# Patient Record
Sex: Female | Born: 2000 | Race: White | Hispanic: No | Marital: Single | Smoking: Current every day smoker
Health system: Southern US, Community
[De-identification: ages and names within clinical notes are randomized; demographics above are authoritative.]

## PROBLEM LIST (undated history)

## (undated) DIAGNOSIS — F329 Major depressive disorder, single episode, unspecified: Secondary | ICD-10-CM

## (undated) DIAGNOSIS — F32A Depression, unspecified: Secondary | ICD-10-CM

## (undated) DIAGNOSIS — K219 Gastro-esophageal reflux disease without esophagitis: Secondary | ICD-10-CM

## (undated) HISTORY — DX: Depression, unspecified: F32.A

## (undated) HISTORY — PX: OTHER SURGICAL HISTORY: SHX169

---
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
## 2020-01-01 ENCOUNTER — Encounter: Payer: Medicaid Other | Admitting: Advanced Practice Midwife

## 2020-01-01 LAB — CBC
HCT: 29.6 % — ABNORMAL LOW (ref 36.0–46.0)
Hemoglobin: 9.8 g/dL — ABNORMAL LOW (ref 12.0–15.0)
MCH: 29.8 pg (ref 26.0–34.0)
MCHC: 33.1 g/dL (ref 30.0–36.0)
MCV: 90 fL (ref 80.0–100.0)
Platelets: 153 10*3/uL (ref 150–400)
RBC: 3.29 MIL/uL — ABNORMAL LOW (ref 3.87–5.11)
RDW: 14.1 % (ref 11.5–15.5)
WBC: 9.6 10*3/uL (ref 4.0–10.5)
nRBC: 0 % (ref 0.0–0.2)

## 2020-01-01 MED ORDER — IBUPROFEN 600 MG PO TABS: 600.0000 mg | ORAL_TABLET | Freq: Four times a day (QID) | ORAL | 0 refills | Status: DC | PRN

## 2020-01-01 MED ORDER — ACETAMINOPHEN 500 MG PO TABS: 1000.0000 mg | ORAL_TABLET | Freq: Four times a day (QID) | ORAL | 0 refills | Status: DC

## 2020-01-01 NOTE — Anesthesia Postprocedure Evaluation (Signed)
Anesthesia Post Note  Patient: Allison Henson  Procedure(s) Performed: AN AD HOC LABOR EPIDURAL  Patient location during evaluation: Mother Baby Anesthesia Type: Epidural Level of consciousness: awake and alert Pain management: pain level controlled Vital Signs Assessment: post-procedure vital signs reviewed and stable Respiratory status: spontaneous breathing, nonlabored ventilation and respiratory function stable Cardiovascular status: stable Postop Assessment: no headache, no backache and epidural receding Anesthetic complications: no   No complications documented.   Last Vitals:  Vitals:   12/31/19 2327 01/01/20 0412  BP: 98/68 99/63  Pulse: 90 73  Resp: 20 18  Temp: 36.7 C 36.7 C  SpO2: 98% 98%    Last Pain:  Vitals:   01/01/20 0610  TempSrc:   PainSc: Asleep                 Karoline Caldwell

## 2020-01-01 NOTE — Discharge Instructions (Signed)
Breast Pumping Tips Breast pumping is a way to get milk out of your breasts. You will then store the milk for your baby to use when you are away from home. There are three ways to pump. You can:  Use your hand to massage and squeeze your breast (hand expression).  Use a hand-held machine to manually pump your milk.  Use an electric machine to pump your milk. In the beginning you may not get much milk. After a few days your breasts should make more. Pumping can help you start making milk after your baby is born. Pumping helps you to keep making milk when you are away from your baby. When should I pump? You can start pumping soon after your baby is born. Follow these tips:  When you are with your baby: ? Pump after you breastfeed. ? Pump from the free breast while you breastfeed.  When you are away from your baby: ? Pump every 2-3 hours for 15 minutes. ? Pump both breasts at the same time if you can.  If your baby drinks formula, pump around the time your baby gets the formula.  If you drank alcohol, wait 2 hours before you pump.  If you are going to have surgery, ask your doctor when you should pump again. How do I get ready to pump? Take steps to relax. Try these things to help your milk come in:  Smell your baby's blanket or clothes.  Look at a picture or video of your baby.  Sit in a quiet, private space.  Massage your breast and nipple.  Place a cloth on your breast. The cloth should be warm and a little wet.  Play relaxing music.  Picture your milk flowing. What are some tips? General tips for pumping breast milk   Always wash your hands before pumping.  If you do not get much milk or if pumping hurts, try different pump settings or a different kind of pump.  Drink enough fluid so your pee (urine) is clear or pale yellow.  Wear clothing that opens in the front or is easy to take off.  Pump milk into a clean bottle or container.  Do not use anything that has  nicotine or tobacco. Examples are cigarettes and e-cigarettes. If you need help quitting, ask your doctor. Tips for storing breast milk   Store breast milk in a clean, BPA-free container. These include: ? A glass or plastic bottle. ? A milk storage bag.  Store only 2-4 ounces of breast milk in each container.  Swirl the breast milk in the container. Do not shake it.  Write down the date you pumped the milk on the container.  This is how long you can store breast milk: ? Room temperature: 6-8 hours. It is best to use the milk within 4 hours. ? Cooler with ice packs: 24 hours. ? Refrigerator: 5-8 days, if the milk is clean. It is best to use the milk within 3 days. ? Freezer: 9-12 months, if the milk is clean and stored away from the freezer door. It is best to use the milk within 6 months.  Put milk in the back of the refrigerator or freezer.  Thaw frozen milk using warm water. Do not use the microwave. Tips for choosing a breast pump When choosing a pump, keep the following things in mind:  Manual breast pumps do not need electricity. They cost less. They can be hard to use.  Electric breast pumps use electricity. They  are more expensive. They are easier to use. They collect more milk.  The suction cup (flange) should be the right size.  Before you buy the pump, check if your insurance will pay for it. Tips for caring for a breast pump  Check the manual that came with your pump for cleaning tips.  Clean the pump after you use it. To do this: ? Wipe down the electrical part. Use a dry cloth or paper towel. Do not put this part in water or in cleaning products. ? Wash the plastic parts with soap and warm water. Or use the dishwasher if the manual says it is safe. You do not need to clean the tubing unless it touched breast milk. ? Let all the parts air dry. Avoid drying them with a cloth or towel. ? When the parts are clean and dry, put the pump back together. Then store the  pump.  If there is water in the tubing when you want to pump: 1. Attach the tubing to the pump. 2. Turn on the pump. 3. Turn off the pump when the tube is dry.  Try not to touch the inside of pump parts. Summary  Pumping can help you start making milk after your baby is born. It lets you keep making milk when you are away from your baby.  When you are away from your baby, pump for about 15 minutes every 2-3 hours. Pump both breasts at the same time, if you can. This information is not intended to replace advice given to you by your health care provider. Make sure you discuss any questions you have with your health care provider. Document Revised: 07/23/2018 Document Reviewed: 05/07/2016 Elsevier Patient Education  2020 [employer].

## 2020-01-01 NOTE — Progress Notes (Signed)
Pt discharged with infant. Discharge instructions, prescriptions, and follow up appointments given to and reviewed with patient. Pt verbalized understanding. Escorted out by staff. 

## 2020-01-01 NOTE — Progress Notes (Signed)
Post Partum Day 1 Subjective: no complaints, up ad lib, voiding and tolerating PO she has declined breastfeeding, but shares that she wasnts to pum and give the baby colostrum. Initially asking for discharge today, but her baby has not voided, or gotten circumcised.  She has not pumped today for colostrum. Feeding her baby formula during this visit.Denies heavy bleeding or perineal pain.  Objective: Blood pressure (!) 100/57, pulse 76, temperature 98.3 F (36.8 C), temperature source Oral, resp. rate 18, height 5\' 7"  (1.702 m), weight 104.8 kg, last menstrual period 03/25/2019, SpO2 99 %, unknown if currently breastfeeding.  Physical Exam:  General: alert, appears stated age and moderately obese Lochia: appropriate Uterine Fundus: firm Incision: healing well DVT Evaluation: No evidence of DVT seen on physical exam. Negative Homan's sign. No cords or calf tenderness.  Recent Labs    12/30/19 1954 01/01/20 0625  HGB 13.0 9.8*  HCT 38.8 29.6*    Assessment/Plan: Plan for discharge tomorrow and Circumcision prior to discharge   LOS: 2 days   01/03/20 01/01/2020, 1:05 PM

## 2020-01-15 ENCOUNTER — Other Ambulatory Visit: Payer: Self-pay

## 2020-01-15 ENCOUNTER — Encounter: Payer: Self-pay | Admitting: Obstetrics and Gynecology

## 2020-01-15 ENCOUNTER — Ambulatory Visit (INDEPENDENT_AMBULATORY_CARE_PROVIDER_SITE_OTHER): Payer: Medicaid Other | Admitting: Obstetrics and Gynecology

## 2020-01-15 DIAGNOSIS — O99345 Other mental disorders complicating the puerperium: Secondary | ICD-10-CM

## 2020-01-15 DIAGNOSIS — F53 Postpartum depression: Secondary | ICD-10-CM

## 2020-01-15 MED ORDER — ESCITALOPRAM OXALATE 10 MG PO TABS: 10.0000 mg | ORAL_TABLET | Freq: Every day | ORAL | 5 refills | Status: DC

## 2020-01-15 NOTE — Patient Instructions (Signed)

## 2020-01-15 NOTE — Progress Notes (Signed)
  OBSTETRICS POSTPARTUM CLINIC PROGRESS NOTE  Subjective:     Allison Henson is a 19 y.o. G73P1001 female who presents for a postpartum visit. She is 2 weeks postpartum following a Term pregnancy and delivery by Vaginal, no problems at delivery.  I have fully reviewed the prenatal and intrapartum course. Anesthesia: epidural.  Postpartum course has been complicated by complicated by depression.  Baby is feeding by Bottle and breast  Bleeding: patient has not  resumed menses.  Bowel function is normal. Bladder function is normal.  Patient is not sexually active.  Postpartum depression screening: positive. Edinburgh 9.  The following portions of the patient's history were reviewed and updated as appropriate: allergies, current medications, past family history, past medical history, past social history, past surgical history and problem list.  Review of Systems Pertinent items are noted in HPI.  Objective:    BP 114/70   Ht 5\' 7"  (1.702 m)   Wt 211 lb 6.4 oz (95.9 kg)   Breastfeeding No   BMI 33.11 kg/m   General:  alert and no distress   Breasts:  inspection negative, no nipple discharge or bleeding, no masses or nodularity palpable  Lungs: clear to auscultation bilaterally  Heart:  regular rate and rhythm, S1, S2 normal, no murmur, click, rub or gallop  Abdomen: soft, non-tender; bowel sounds normal; no masses,  no organomegaly.                              Assessment:  Post Partum Care visit 1. Postpartum depression associated with first pregnancy   - escitalopram (LEXAPRO) 10 MG tablet; Take 1 tablet (10 mg total) by mouth daily. May increase to 20 mg a day after 1 week.  Dispense: 60 tablet; Refill: 5  - Discussed Zulresso as an option.   2. Postpartum care and examination     Plan:  See orders and Patient Instructions Follow up in: 2 weeks or as needed.   MD, Adelene Idler OB/GYN, [facility] 01/15/2020 2:39 PM

## 2020-01-15 NOTE — Progress Notes (Signed)
Pt here for a Postpartum check up. Pt states she has been depressed lately.

## 2020-01-26 ENCOUNTER — Telehealth: Payer: Medicaid Other

## 2020-01-26 ENCOUNTER — Other Ambulatory Visit: Payer: Self-pay

## 2020-01-26 ENCOUNTER — Encounter: Payer: Self-pay | Admitting: Obstetrics and Gynecology

## 2020-01-26 NOTE — Telephone Encounter (Signed)
TELEPHONE VISIT   POSTPARTUM CHECK

## 2020-02-12 ENCOUNTER — Encounter: Payer: Self-pay | Admitting: Obstetrics and Gynecology

## 2020-02-12 ENCOUNTER — Other Ambulatory Visit: Payer: Self-pay

## 2020-02-12 ENCOUNTER — Ambulatory Visit: Payer: Medicaid Other | Admitting: Obstetrics and Gynecology

## 2020-02-12 ENCOUNTER — Ambulatory Visit (INDEPENDENT_AMBULATORY_CARE_PROVIDER_SITE_OTHER): Payer: Medicaid Other | Admitting: Obstetrics and Gynecology

## 2020-02-12 VITALS — BP 120/70 | Ht 67.0 in | Wt 213.2 lb

## 2020-02-12 DIAGNOSIS — O99345 Other mental disorders complicating the puerperium: Secondary | ICD-10-CM

## 2020-02-12 DIAGNOSIS — Z30011 Encounter for initial prescription of contraceptive pills: Secondary | ICD-10-CM

## 2020-02-12 DIAGNOSIS — F53 Postpartum depression: Secondary | ICD-10-CM

## 2020-02-12 MED ORDER — NORETHIN ACE-ETH ESTRAD-FE 1-20 MG-MCG PO TABS: 1.0000 | ORAL_TABLET | Freq: Every day | ORAL | 11 refills | Status: DC

## 2020-02-12 NOTE — Progress Notes (Signed)
  OBSTETRICS POSTPARTUM CLINIC PROGRESS NOTE  Subjective:     Allison Henson is a 19 y.o. G81P1001 female who presents for a postpartum visit. She is 6 weeks postpartum following a Term pregnancy and delivery by Vaginal, no problems at delivery.  I have fully reviewed the prenatal and intrapartum course. Anesthesia: epidural.  Postpartum course has been complicated by complicated by depression.  Baby is feeding by Bottle.  Bleeding: patient has not  resumed menses.  Bowel function is normal. Bladder function is normal.  Patient is sexually active. Contraception method desired is OCP (estrogen/progesterone).  Postpartum depression screening: negative, taking lexapro. [place] 0.  The following portions of the patient's history were reviewed and updated as appropriate: allergies, current medications, past family history, past medical history, past social history, past surgical history and problem list.  Review of Systems Pertinent items are noted in HPI.  Objective:    BP 120/70   Ht 5\' 7"  (1.702 m)   Wt 213 lb 3.2 oz (96.7 kg)   Breastfeeding No   BMI 33.39 kg/m   General:  alert and no distress   Breasts:  inspection negative, no nipple discharge or bleeding, no masses or nodularity palpable  Lungs: clear to auscultation bilaterally  Heart:  regular rate and rhythm, S1, S2 normal, no murmur, click, rub or gallop  Abdomen: soft, non-tender; bowel sounds normal; no masses,  no organomegaly.     Vulva: Declined exam  Vagina:   Cervix:    Corpus:   Adnexa:    Rectal Exam:           Assessment:  Post Partum Care visit 1. Postpartum depression associated with first pregnancy - continue Lexapro  2. Postpartum care and examination    3. BCP (birth control pills) initiation   - norethindrone-ethinyl estradiol (JUNEL FE 1/20) 1-20 MG-MCG tablet; Take 1 tablet by mouth daily.  Dispense: 28 tablet; Refill: 11  Has had unprotected intercourse. Recommended plan B or copper  IUD, she declines.  Encouraged to take home pregnancy test in 2 weeks, call if positive.  Discussed refraining from unprotected intercourse for 1 week after starting OCP.    Plan:  See orders and Patient Instructions Follow up in: 12 months or as needed.   2/20 MD, [facility], [facility] 02/12/2020 12:06 PM

## 2020-02-12 NOTE — Progress Notes (Signed)
Pt here for a Postpartum Check. 

## 2020-02-12 NOTE — Patient Instructions (Signed)

## 2020-04-27 ENCOUNTER — Other Ambulatory Visit: Payer: Self-pay

## 2020-04-27 ENCOUNTER — Encounter: Payer: Self-pay | Admitting: Obstetrics and Gynecology

## 2020-04-27 ENCOUNTER — Ambulatory Visit (INDEPENDENT_AMBULATORY_CARE_PROVIDER_SITE_OTHER): Payer: Medicaid Other | Admitting: Obstetrics and Gynecology

## 2020-04-27 ENCOUNTER — Other Ambulatory Visit (HOSPITAL_COMMUNITY)
Admission: RE | Admit: 2020-04-27 | Discharge: 2020-04-27 | Disposition: A | Discharge status: Home / Self Care | Payer: Medicaid Other | Source: Ambulatory Visit | Attending: Obstetrics and Gynecology | Admitting: Obstetrics and Gynecology

## 2020-04-27 VITALS — BP 122/74 | Wt 224.0 lb

## 2020-04-27 DIAGNOSIS — N926 Irregular menstruation, unspecified: Secondary | ICD-10-CM

## 2020-04-27 DIAGNOSIS — Z7185 Encounter for immunization safety counseling: Secondary | ICD-10-CM

## 2020-04-27 DIAGNOSIS — Z3482 Encounter for supervision of other normal pregnancy, second trimester: Secondary | ICD-10-CM | POA: Insufficient documentation

## 2020-04-27 DIAGNOSIS — Z6835 Body mass index (BMI) 35.0-35.9, adult: Secondary | ICD-10-CM | POA: Insufficient documentation

## 2020-04-27 DIAGNOSIS — Z369 Encounter for antenatal screening, unspecified: Secondary | ICD-10-CM

## 2020-04-27 DIAGNOSIS — O09891 Supervision of other high risk pregnancies, first trimester: Secondary | ICD-10-CM

## 2020-04-27 DIAGNOSIS — O99211 Obesity complicating pregnancy, first trimester: Secondary | ICD-10-CM

## 2020-04-27 DIAGNOSIS — Z3480 Encounter for supervision of other normal pregnancy, unspecified trimester: Secondary | ICD-10-CM | POA: Insufficient documentation

## 2020-04-27 DIAGNOSIS — Z113 Encounter for screening for infections with a predominantly sexual mode of transmission: Secondary | ICD-10-CM

## 2020-04-27 DIAGNOSIS — Z3490 Encounter for supervision of normal pregnancy, unspecified, unspecified trimester: Secondary | ICD-10-CM | POA: Insufficient documentation

## 2020-04-27 DIAGNOSIS — Z3481 Encounter for supervision of other normal pregnancy, first trimester: Secondary | ICD-10-CM

## 2020-04-27 LAB — OB RESULTS CONSOLE VARICELLA ZOSTER ANTIBODY, IGG: Varicella: IMMUNE

## 2020-04-27 LAB — POCT URINE PREGNANCY: Preg Test, Ur: POSITIVE — AB

## 2020-04-27 NOTE — Progress Notes (Signed)
New Obstetric Patient H&P    Chief Complaint: Missed period and positive home pregnancy test   History of Present Illness: Patient is a 20 y.o. G2P1001 Not Hispanic or Latino female, presents with amenorrhea and positive home pregnancy test. Patient's last menstrual period was 03/23/2020. and based on her  LMP, her EDD is Estimated Date of Delivery: 12/28/20 and her EGA is [redacted]w[redacted]d. Cycles are 5. days, regular, and occur approximately every : 28 days.  She had a urine pregnancy test which was positive 1 or 2 week(s)  ago. Her last menstrual period was normal and lasted for  4 or 5 day(s). Since her LMP she claims she has experienced no S&S of pregnancy. She denies vaginal bleeding. Her past medical history is noncontributory. Her prior pregnancies are notable for NSVD, pelvis proven to 7lb 1 oz. Hx of postpartum depression - tx with lexapro.  Since her LMP, she admits to the use of tobacco products  yes She claims she has gained   no pounds since the start of her pregnancy.  There are cats in the home in the home  no She admits close contact with children on a regular basis  yes  She has had chicken pox in the past yes She has had Tuberculosis exposures, symptoms, or previously tested positive for TB   no Current or past history of domestic violence. no  Genetic Screening/Teratology Counseling: (Includes patient, baby's father, or anyone in either family with:)   1. Patient's age >/= 51 [facility]  no 2. Thalassemia ([place], [place], Mediterranean, or Asian background): MCV<80  no 3. Neural tube defect (meningomyelocele, spina bifida, anencephaly)  no 4. Congenital heart defect  no  5. Down syndrome  no 6. Tay-Sachs (Jewish, [place]  no 7. Canavan's Disease  no 8. Sickle cell disease or trait (African)  no  9. Hemophilia or other blood disorders  no  10. Muscular dystrophy  no  11. Cystic fibrosis  no  12. Huntington's Chorea  no  13. Mental retardation/autism  no 14. Other  inherited genetic or chromosomal disorder  no 15. Maternal metabolic disorder (DM, PKU, etc)  no 16. Patient or FOB with a child with a birth defect not listed above no  16a. Patient or FOB with a birth defect themselves no 17. Recurrent pregnancy loss, or stillbirth  no  18. Any medications since LMP other than prenatal vitamins (include vitamins, supplements, OTC meds, drugs, alcohol)  no 19. Any other genetic/environmental exposure to discuss  no  Infection History:   1. Lives with someone with TB or TB exposed  no  2. Patient or partner has history of genital herpes  no 3. Rash or viral illness since LMP  no 4. History of STI (GC, CT, HPV, syphilis, HIV)  yes 5. History of recent travel :  no  Other pertinent information:  Lives with mother. Currently unemployed.     Review of Systems:10 point review of systems negative unless otherwise noted in HPI  Past Medical History:  Patient Active Problem List   Diagnosis Date Noted  . Supervision of normal pregnancy 04/27/2020    Clinic [facility] Prenatal Labs  Dating  LMP Blood type: --/--/O POS (09/15 1953)   Genetic Screen   NIPS: Antibody:NEG (09/15 1953)  Anatomic [place]  Rubella: 2.64 (03/16 1454) Varicella: @VZVIGG @  GTT Early:               Third trimester:  RPR: NON REACTIVE (09/15 1954)   Rhogam  HBsAg: Negative (03/16 1454)   TDaP vaccine                       Flu Shot: HIV: Non Reactive (07/07 0913)   Baby Food    formula                  TKZ:SWFUXNAT/-- (08/30 1431)  Contraception  Pap: n/a  CBB     CS/VBAC    Support Person  mother        . Short interval between pregnancies affecting pregnancy in first trimester, antepartum 04/27/2020  . Maternal obesity, antepartum, first trimester 04/27/2020    BMI >=35.0-39.9 [ ]  early 1h gtt - ordered [ ]  u/s for dating - ordered [x]  nutritional goals - reviewed at NOB [x]  folic acid 1mg  [ ]  bASA (>12 weeks) [ ]  consider nutrition consult [ ]  consider maternal EKG 1st  trimester [ ]  Growth u/s 28 [ ] , 32 [ ] , 36 weeks [ ]  [ ]  NST/AFI weekly 37+ weeks (37[] , 38[] , 39[] , 40[] ) [ ]  IOL by 41 weeks (scheduled, prn [] )   . BMI 35.0-35.9,adult 04/27/2020    Past Surgical History:  Past Surgical History:  Procedure Laterality Date  . breast lump aspiration    . nexplanon  2016    Gynecologic History: Patient's last menstrual period was 03/23/2020.  Obstetric History: G2P1001  Family History:  No family history on file.  Social History:  Social History   Socioeconomic History  . Marital status: Single    Spouse name: Not on file  . Number of children: Not on file  . Years of education: Not on file  . Highest education level: Not on file  Occupational History  . Not on file  Tobacco Use  . Smoking status: Former Smoker    Types: Cigarettes  . Smokeless tobacco: Never Used  Vaping Use  . Vaping Use: Some days  Substance and Sexual Activity  . Alcohol use: Never  . Drug use: Not Currently    Types: Marijuana    Comment: previous marijuana use  . Sexual activity: Yes    Birth control/protection: None  Other Topics Concern  . Not on file  Social History Narrative  . Not on file   Social Determinants of Health   Financial Resource Strain: Not on file  Food Insecurity: Not on file  Transportation Needs: Not on file  Physical Activity: Not on file  Stress: Not on file  Social Connections: Not on file  Intimate Partner Violence: Not on file    Allergies:  No Known Allergies  Medications: Prior to Admission medications   Not on File    Physical Exam Vitals: Blood pressure 122/74, weight 224 lb (101.6 kg), last menstrual period 03/23/2020, not currently breastfeeding.  General: NAD HEENT: normocephalic, anicteric Thyroid: no enlargement, no palpable nodules Pulmonary: No increased work of breathing, CTAB Cardiovascular: RRR, distal pulses 2+ Abdomen: NABS, soft, non-tender, non-distended.  Umbilicus without lesions.  No  hepatomegaly, splenomegaly or masses palpable. No evidence of hernia  Genitourinary:  External: Normal external female genitalia.  Normal urethral meatus, normal  Bartholin's and Skene's glands.    Vagina: Normal vaginal mucosa, no evidence of prolapse.    Cervix: Grossly normal in appearance, no bleeding  Uterus: Enlarge (6 wk size), mobile, normal contour.  No CMT  Adnexa: ovaries non-enlarged, no adnexal masses  Rectal: deferred Extremities: no edema, erythema, or tenderness Neurologic: Grossly intact Psychiatric: mood appropriate, affect full  Assessment: 20 y.o. G2P1001 at [redacted]w[redacted]d presenting to initiate prenatal care  Plan: 1) Avoid alcoholic beverages. 2) Patient encouraged not to smoke.  3) Discontinue the use of all non-medicinal drugs and chemicals.  4) Take prenatal vitamins daily.  5) Nutrition, food safety (fish, cheese advisories, and high nitrite foods) and exercise discussed. 6) Hospital and practice style discussed with cross coverage system.  7) Genetic Screening, such as with 1st Trimester Screening, cell free fetal DNA, AFP testing, and Ultrasound, as well as with amniocentesis and CVS as appropriate, is discussed with patient. At the conclusion of today's visit patient undecided genetic testing  8) BMI >35, dating [place] at next visit, 1h GTT 9) NOB labs collected today   Zipporah Plants, CNM, MSN [facility], [facility] 04/27/2020, 3:54 PM

## 2020-04-28 LAB — URINE DRUG PANEL 7
Amphetamines, Urine: NEGATIVE ng/mL
Barbiturate Quant, Ur: NEGATIVE ng/mL
Benzodiazepine Quant, Ur: NEGATIVE ng/mL
Cannabinoid Quant, Ur: NEGATIVE ng/mL
Cocaine (Metab.): NEGATIVE ng/mL
Opiate Quant, Ur: NEGATIVE ng/mL
PCP Quant, Ur: NEGATIVE ng/mL

## 2020-04-28 LAB — RPR+RH+ABO+RUB AB+AB SCR+CB...
Antibody Screen: NEGATIVE
HIV Screen 4th Generation wRfx: NONREACTIVE
Hematocrit: 38.2 % (ref 34.0–46.6)
Hemoglobin: 12.7 g/dL (ref 11.1–15.9)
Hepatitis B Surface Ag: NEGATIVE
MCH: 27 pg (ref 26.6–33.0)
MCHC: 33.2 g/dL (ref 31.5–35.7)
MCV: 81 fL (ref 79–97)
Platelets: 272 10*3/uL (ref 150–450)
RBC: 4.71 x10E6/uL (ref 3.77–5.28)
RDW: 14.2 % (ref 11.7–15.4)
RPR Ser Ql: NONREACTIVE
Rh Factor: POSITIVE
Rubella Antibodies, IGG: 2.29 index (ref >0.99)
Varicella zoster IgG: 996 index (ref >165)
WBC: 8.5 10*3/uL (ref 3.4–10.8)

## 2020-04-29 ENCOUNTER — Encounter: Payer: Self-pay | Admitting: Obstetrics and Gynecology

## 2020-04-29 LAB — CERVICOVAGINAL ANCILLARY ONLY
Bacterial Vaginitis (gardnerella): NEGATIVE
Candida Glabrata: NEGATIVE
Candida Vaginitis: NEGATIVE
Chlamydia: NEGATIVE
Comment: NEGATIVE
Comment: NEGATIVE
Comment: NEGATIVE
Comment: NEGATIVE
Comment: NEGATIVE
Comment: NORMAL
Neisseria Gonorrhea: NEGATIVE
Trichomonas: NEGATIVE

## 2020-04-29 LAB — URINE CULTURE

## 2020-05-18 ENCOUNTER — Other Ambulatory Visit: Payer: Self-pay

## 2020-05-18 ENCOUNTER — Ambulatory Visit (INDEPENDENT_AMBULATORY_CARE_PROVIDER_SITE_OTHER): Payer: Medicaid Other

## 2020-05-18 ENCOUNTER — Ambulatory Visit (INDEPENDENT_AMBULATORY_CARE_PROVIDER_SITE_OTHER): Payer: Medicaid Other | Admitting: Obstetrics and Gynecology

## 2020-05-18 ENCOUNTER — Other Ambulatory Visit: Payer: Medicaid Other

## 2020-05-18 VITALS — BP 120/72 | Wt 222.0 lb

## 2020-05-18 DIAGNOSIS — Z3481 Encounter for supervision of other normal pregnancy, first trimester: Secondary | ICD-10-CM

## 2020-05-18 DIAGNOSIS — Z3A08 8 weeks gestation of pregnancy: Secondary | ICD-10-CM

## 2020-05-18 DIAGNOSIS — O99211 Obesity complicating pregnancy, first trimester: Secondary | ICD-10-CM

## 2020-05-18 DIAGNOSIS — O09891 Supervision of other high risk pregnancies, first trimester: Secondary | ICD-10-CM

## 2020-05-18 DIAGNOSIS — N926 Irregular menstruation, unspecified: Secondary | ICD-10-CM | POA: Diagnosis not present

## 2020-05-18 LAB — POCT URINALYSIS DIPSTICK OB: Glucose, UA: NEGATIVE

## 2020-05-18 MED ORDER — SERTRALINE HCL 50 MG PO TABS: 50.0000 mg | ORAL_TABLET | Freq: Every day | ORAL | 2 refills | Status: DC

## 2020-05-18 NOTE — Progress Notes (Signed)
Routine Prenatal Care Visit  Subjective  Allison Henson is a 20 y.o. G2P1001 at [redacted]w[redacted]d being seen today for ongoing prenatal care.  She is currently monitored for the following issues for this low-risk pregnancy and has Encounter for supervision of other normal pregnancy, first trimester; Short interval between pregnancies affecting pregnancy in first trimester, antepartum; Maternal obesity, antepartum, first trimester; and BMI 35.0-35.9,adult on their problem list.  ----------------------------------------------------------------------------------- Patient reports no complaints.   Contractions: Not present. Vag. Bleeding: None.  Movement: Absent. Denies leaking of fluid.  ----------------------------------------------------------------------------------- The following portions of the patient's history were reviewed and updated as appropriate: allergies, current medications, past family history, past medical history, past social history, past surgical history and problem list. Problem list updated.   Objective  Blood pressure 120/72, weight 222 lb (100.7 kg), last menstrual period 03/23/2020, not currently breastfeeding. Pregravid weight 224 lb (101.6 kg) Total Weight Gain -2 lb (-0.907 kg)  Body mass index is 34.77 kg/m.  Urinalysis:      Fetal Status:     Movement: Absent     General:  Alert, oriented and cooperative. Patient is in no acute distress.  Skin: Skin is warm and dry. No rash noted.   Cardiovascular: Normal heart rate noted  Respiratory: Normal respiratory effort, no problems with respiration noted  Abdomen: Soft, gravid, appropriate for gestational age. Pain/Pressure: Absent     Pelvic:  Cervical exam deferred        Extremities: Normal range of motion.     ental Status: Normal mood and affect. Normal behavior. Normal judgment and thought content.   US OB Comp Less 14 Wks  Result Date: 05/18/2020 Patient Name: Allison Henson [facility] DOB: [birth year] MRN: [id number]  ULTRASOUND REPORT Location: [facility] Date of Service: 05/18/2020 Indications:dating Findings: Mason Jim intrauterine pregnancy is visualized with a CRL consistent with [redacted]w[redacted]d gestation, giving an (U/S) EDD of 12/26/20. The (U/S) EDD is consistent with the clinically established EDD of 12/28/20. FHR: 160 BPM CRL measurement: 17.8 mm Yolk sac is visualized and appears normal. Amnion: visualized and appears normal [facility] measuring 2.4cm Right Ovary is normal in appearance. Left Ovary is normal appearance. Corpus luteal cyst:  Right ovary Survey of the adnexa demonstrates no adnexal masses. There is no free peritoneal fluid in the cul de sac. Impression: 1. [redacted]w[redacted]d Viable Singleton Intrauterine pregnancy by U/S. 2. (U/S) EDD is consistent with Clinically established EDD of 12/28/20. Recommendations: 1.Clinical correlation with the patient's History and Physical Exam. Darlina Guys, RT There is a viable singleton gestation.  The fetal biometry correlates with established dating. Detailed evaluation of the fetal anatomy is precluded by early gestational age.  It must be noted that a normal ultrasound particular at this early gestational age is unable to rule out fetal aneuploidy, risk of first trimester miscarriage, or anatomic birth defects. Vena [place], MD, Evern Core [facility], [facility] 05/18/2020, 4:09 PM     Assessment   20 y.o. G2P1001 at [redacted]w[redacted]d by  12/28/2020, by Last Menstrual Period presenting for routine prenatal visit  Plan   pregnancy Problems (from 04/27/20 to present)    Problem Noted Resolved   Encounter for supervision of other normal pregnancy, first trimester 04/27/2020 by Zipporah Plants, CNM No   Overview Addendum 04/29/2020  8:30 AM by Zipporah Plants, CNM     Nursing Staff Provider  Office Location  [facility] Dating   LMP  Language  English Anatomy US    Flu Vaccine  declined Genetic Screen  NIPS:  desires   TDaP vaccine    Hgb A1C or  GTT Early : Third trimester :    Rhogam   n/a    LAB RESULTS   Feeding Plan  formula Blood Type   O+  Contraception  Antibody   negative  Circumcision  Rubella   immune  Pediatrician   RPR   non-reactive  Support Person  Mother HBsAg  negative  Prenatal Classes  HIV  non-reactive    Varicella  immune  BTL Consent  n/a GBS  (For PCN allergy, check sensitivities)        VBAC Consent  n/a Pap  <21 yo    Hgb Electro   normal hgb    CF      SMA         Pregnancy diagnoses  Pregravid BMI: 35.1 Short interval pregnancy (last delivered 9/21)      Previous Version       Gestational age appropriate obstetric precautions including but not limited to vaginal bleeding, contractions, leaking of fluid and fetal movement were reviewed in detail with the patient.    - EDD confirmed by [place] today  - Nausea with lexapro 10mg  discontinued, switch to Zoloft 50mg   Return in about 3 weeks (around 06/08/2020) for ROB and genetcis.  , MD, 06/10/2020 [facility], [facility] 05/18/2020, 4:27 PM

## 2020-05-19 LAB — GLUCOSE, 1 HOUR GESTATIONAL: Gestational Diabetes Screen: 86 mg/dL (ref 65–139)

## 2020-05-20 ENCOUNTER — Other Ambulatory Visit: Payer: Self-pay | Admitting: Obstetrics and Gynecology

## 2020-05-20 MED ORDER — OMEPRAZOLE 20 MG PO CPDR: 20.0000 mg | DELAYED_RELEASE_CAPSULE | Freq: Every day | ORAL | 11 refills | Status: DC

## 2020-05-20 MED ORDER — DOXYLAMINE-PYRIDOXINE 10-10 MG PO TBEC: 2.0000 | DELAYED_RELEASE_TABLET | Freq: Every day | ORAL | 5 refills | Status: DC

## 2020-06-01 ENCOUNTER — Encounter: Payer: Self-pay | Admitting: Obstetrics and Gynecology

## 2020-06-01 ENCOUNTER — Ambulatory Visit (INDEPENDENT_AMBULATORY_CARE_PROVIDER_SITE_OTHER): Payer: Medicaid Other | Admitting: Obstetrics and Gynecology

## 2020-06-01 ENCOUNTER — Other Ambulatory Visit: Payer: Self-pay

## 2020-06-01 VITALS — BP 116/72 | Ht 67.0 in | Wt 219.6 lb

## 2020-06-01 DIAGNOSIS — Z1379 Encounter for other screening for genetic and chromosomal anomalies: Secondary | ICD-10-CM

## 2020-06-01 DIAGNOSIS — Z3A10 10 weeks gestation of pregnancy: Secondary | ICD-10-CM

## 2020-06-01 DIAGNOSIS — O219 Vomiting of pregnancy, unspecified: Secondary | ICD-10-CM

## 2020-06-01 DIAGNOSIS — Z3481 Encounter for supervision of other normal pregnancy, first trimester: Secondary | ICD-10-CM

## 2020-06-01 LAB — POCT URINALYSIS DIPSTICK OB
Glucose, UA: NEGATIVE
POC,PROTEIN,UA: NEGATIVE

## 2020-06-01 MED ORDER — DOXYLAMINE-PYRIDOXINE 10-10 MG PO TBEC: 2.0000 | DELAYED_RELEASE_TABLET | Freq: Every day | ORAL | 5 refills | Status: DC

## 2020-06-01 NOTE — Addendum Note (Signed)
Addended by: Clement Husbands A on: 06/01/2020 03:29 PM   Modules accepted: Orders

## 2020-06-01 NOTE — Progress Notes (Signed)
Routine Prenatal Care Visit  Subjective  Allison Henson is a 20 y.o. G2P1001 at [redacted]w[redacted]d being seen today for ongoing prenatal care.  She is currently monitored for the following issues for this low-risk pregnancy and has Encounter for supervision of other normal pregnancy, first trimester; Short interval between pregnancies affecting pregnancy in first trimester, antepartum; Maternal obesity, antepartum, first trimester; and BMI 35.0-35.9,adult on their problem list.  ----------------------------------------------------------------------------------- Patient reports increase in sx of nausea. Patient states she think it might be related to taking her Zoloft irregularly. Patient states overall she feels like her sx of depression are well controlled. Contractions: Not present. Vag. Bleeding: None.  Movement: Absent. Denies leaking of fluid.  ----------------------------------------------------------------------------------- The following portions of the patient's history were reviewed and updated as appropriate: allergies, current medications, past family history, past medical history, past social history, past surgical history and problem list. Problem list updated.   Objective  Blood pressure 116/72, height 5\' 7"  (1.702 m), weight 219 lb 9.6 oz (99.6 kg), last menstrual period 03/23/2020, not currently breastfeeding. Pregravid weight 224 lb (101.6 kg) Total Weight Gain -4 lb 6.4 oz (-1.996 kg) Urinalysis:      Fetal Status: Fetal Heart Rate (bpm): 160   Movement: Absent     General:  Alert, oriented and cooperative. Patient is in no acute distress.  Skin: Skin is warm and dry. No rash noted.   Cardiovascular: Normal heart rate noted  Respiratory: Normal respiratory effort, no problems with respiration noted  Abdomen: Soft, gravid, appropriate for gestational age. Pain/Pressure: Absent     Pelvic:  Cervical exam deferred        Extremities: Normal range of motion.     ental Status:  Normal mood and affect. Normal behavior. Normal judgment and thought content.     Assessment   20 y.o. G2P1001 at [redacted]w[redacted]d by  12/28/2020, by Last Menstrual Period presenting for routine prenatal visit  Plan   pregnancy Problems (from 04/27/20 to present)    Problem Noted Resolved   Encounter for supervision of other normal pregnancy, first trimester 04/27/2020 by 06/25/2020, CNM No   Overview Addendum 05/18/2020  4:26 PM by 07/16/2020, MD     Nursing Staff Provider  Office Location  [facility] Dating   LMP = 8 week Vena [place]  Language  English Anatomy US    Flu Vaccine  declined Genetic Screen  NIPS:  desires   TDaP vaccine    Hgb A1C or  GTT Early : Third trimester :   Rhogam   n/a    LAB RESULTS   Feeding Plan  formula Blood Type   O+  Contraception  Antibody   negative  Circumcision  Rubella   immune  Pediatrician   RPR   non-reactive  Support Person  Mother HBsAg  negative  Prenatal Classes  HIV  non-reactive    Varicella  immune  BTL Consent  n/a GBS  (For PCN allergy, check sensitivities)        VBAC Consent  n/a Pap  <21 yo    Hgb Electro   normal hgb    CF      SMA         Pregnancy diagnoses  Pregravid BMI: 35.1 Short interval pregnancy (last delivered 9/21)      Previous Version      -Patient desires to continue Zoloft, discussed optimizing effect by taking medication daily -N/V in pregnancy - diclegis rx'd -NIPS collected today  First trimester precautions including  but not limited to vaginal bleeding, contractions, leaking of fluid and fetal movement were reviewed in detail with the patient.    Return in about 4 weeks (around 06/29/2020) for ROB.  Zipporah Plants, CNM, MSN [facility], [facility] 06/01/2020, 2:48 PM

## 2020-06-05 LAB — MATERNIT 21 PLUS CORE, BLOOD
Fetal Fraction: 8
Result (T21): NEGATIVE
Trisomy 13 (Patau syndrome): NEGATIVE
Trisomy 18 (Edwards syndrome): NEGATIVE
Trisomy 21 (Down syndrome): NEGATIVE

## 2020-06-17 ENCOUNTER — Encounter: Payer: Medicaid Other | Admitting: Obstetrics and Gynecology

## 2020-06-29 ENCOUNTER — Encounter: Payer: Medicaid Other | Admitting: Advanced Practice Midwife

## 2020-06-30 ENCOUNTER — Ambulatory Visit (INDEPENDENT_AMBULATORY_CARE_PROVIDER_SITE_OTHER): Payer: Medicaid Other | Admitting: Obstetrics and Gynecology

## 2020-06-30 ENCOUNTER — Other Ambulatory Visit: Payer: Self-pay

## 2020-06-30 ENCOUNTER — Encounter: Payer: Self-pay | Admitting: Obstetrics and Gynecology

## 2020-06-30 VITALS — BP 120/70 | Ht 67.0 in | Wt 222.4 lb

## 2020-06-30 DIAGNOSIS — Z3A14 14 weeks gestation of pregnancy: Secondary | ICD-10-CM

## 2020-06-30 DIAGNOSIS — O219 Vomiting of pregnancy, unspecified: Secondary | ICD-10-CM

## 2020-06-30 DIAGNOSIS — Z3481 Encounter for supervision of other normal pregnancy, first trimester: Secondary | ICD-10-CM

## 2020-06-30 LAB — POCT URINALYSIS DIPSTICK OB
Glucose, UA: NEGATIVE
POC,PROTEIN,UA: NEGATIVE

## 2020-06-30 MED ORDER — ONDANSETRON 4 MG PO TBDP: 4.0000 mg | ORAL_TABLET | Freq: Four times a day (QID) | ORAL | 3 refills | Status: DC | PRN

## 2020-06-30 NOTE — Progress Notes (Signed)
Routine Prenatal Care Visit  Subjective  Allison Henson is a 20 y.o. G2P1001 at [redacted]w[redacted]d being seen today for ongoing prenatal care.  She is currently monitored for the following issues for this low-risk pregnancy and has Encounter for supervision of other normal pregnancy, first trimester; Short interval between pregnancies affecting pregnancy in first trimester, antepartum; Maternal obesity, antepartum, first trimester; and BMI 35.0-35.9,adult on their problem list.  ----------------------------------------------------------------------------------- Patient reports continued nausea despite diclegis." I'm wretching so much it hurts."  No emesis.  Contractions: Not present. Vag. Bleeding: None.  Movement: Present. Denies leaking of fluid.  ----------------------------------------------------------------------------------- The following portions of the patient's history were reviewed and updated as appropriate: allergies, current medications, past family history, past medical history, past social history, past surgical history and problem list. Problem list updated.   Objective  Blood pressure 120/70, height 5\' 7"  (1.702 m), weight 222 lb 6.4 oz (100.9 kg), last menstrual period 03/23/2020, not currently breastfeeding. Pregravid weight 224 lb (101.6 kg) Total Weight Gain -1 lb 9.6 oz (-0.726 kg) Urinalysis:      Fetal Status: Fetal Heart Rate (bpm): 134   Movement: Present     General:  Alert, oriented and cooperative. Patient is in no acute distress.  Skin: Skin is warm and dry. No rash noted.   Cardiovascular: Normal heart rate noted  Respiratory: Normal respiratory effort, no problems with respiration noted  Abdomen: Soft, gravid, appropriate for gestational age. Pain/Pressure: Absent     Pelvic:  Cervical exam deferred        Extremities: Normal range of motion.     Mental Status: Normal mood and affect. Normal behavior. Normal judgment and thought content.     Assessment    20 y.o. G2P1001 at [redacted]w[redacted]d by  12/28/2020, by Last Menstrual Period presenting for routine prenatal visit  Plan   pregnancy Problems (from 04/27/20 to present)    Problem Noted Resolved   Encounter for supervision of other normal pregnancy, first trimester 04/27/2020 by 06/25/2020, CNM No   Overview Addendum 05/18/2020  4:26 PM by 07/16/2020, MD     Nursing Staff Provider  Office Location  [facility] Dating   LMP = 8 week Vena [place]  Language  English Anatomy US    Flu Vaccine  declined Genetic Screen  NIPS:  desires   TDaP vaccine    Hgb A1C or  GTT Early : Third trimester :   Rhogam   n/a    LAB RESULTS   Feeding Plan  formula Blood Type   O+  Contraception  Antibody   negative  Circumcision  Rubella   immune  Pediatrician   RPR   non-reactive  Support Person  Mother HBsAg  negative  Prenatal Classes  HIV  non-reactive    Varicella  immune  BTL Consent  n/a GBS  (For PCN allergy, check sensitivities)        VBAC Consent  n/a Pap  <21 yo    Hgb Electro   normal hgb    CF      SMA         Pregnancy diagnoses  Pregravid BMI: 35.1 Short interval pregnancy (last delivered 9/21)      Previous Version       zofran for nausea  Gestational age appropriate obstetric precautions including but not limited to vaginal bleeding, contractions, leaking of fluid and fetal movement were reviewed in detail with the patient.    Return in about 4 weeks (around 07/28/2020) for ROB  and anatomy US.  Natale Milch MD [facility], [facility] 06/30/2020, 3:34 PM

## 2020-06-30 NOTE — Addendum Note (Signed)
Addended by: Clement Husbands A on: 06/30/2020 04:49 PM   Modules accepted: Orders

## 2020-06-30 NOTE — Patient Instructions (Signed)

## 2020-07-01 ENCOUNTER — Other Ambulatory Visit: Payer: Self-pay | Admitting: Obstetrics and Gynecology

## 2020-07-01 NOTE — Telephone Encounter (Signed)
Please advise 

## 2020-07-05 ENCOUNTER — Other Ambulatory Visit: Payer: Self-pay

## 2020-07-05 ENCOUNTER — Telehealth: Payer: Self-pay

## 2020-07-05 ENCOUNTER — Encounter: Payer: Self-pay | Admitting: Advanced Practice Midwife

## 2020-07-05 ENCOUNTER — Ambulatory Visit (INDEPENDENT_AMBULATORY_CARE_PROVIDER_SITE_OTHER): Payer: Medicaid Other | Admitting: Advanced Practice Midwife

## 2020-07-05 ENCOUNTER — Encounter: Payer: Medicaid Other | Admitting: Advanced Practice Midwife

## 2020-07-05 VITALS — BP 122/74 | Wt 219.0 lb

## 2020-07-05 DIAGNOSIS — Z3482 Encounter for supervision of other normal pregnancy, second trimester: Secondary | ICD-10-CM

## 2020-07-05 DIAGNOSIS — Z3A14 14 weeks gestation of pregnancy: Secondary | ICD-10-CM

## 2020-07-05 NOTE — Progress Notes (Signed)
No vb. No lof. Pt c/o "greenish yellow d/c" " not feeling baby move"

## 2020-07-05 NOTE — Telephone Encounter (Signed)
Patient should been seen at the hospital

## 2020-07-05 NOTE — Telephone Encounter (Signed)
This is a patient who should go to the hospital to be seen

## 2020-07-05 NOTE — Progress Notes (Signed)
Routine Prenatal Care Visit  Subjective  Allison Henson is a 20 y.o. G2P1001 at [redacted]w[redacted]d being seen today for ongoing prenatal care.  She is currently monitored for the following issues for this low-risk pregnancy and has Encounter for supervision of other normal pregnancy, first trimester; Short interval between pregnancies affecting pregnancy in first trimester, antepartum; Maternal obesity, antepartum, first trimester; and BMI 35.0-35.9,adult on their problem list.  ----------------------------------------------------------------------------------- Patient reports having nausea, vomiting and diarrhea all day yesterday. She denies fever or other s/s of illness. Today she is feeling better and was able to keep food and drink down. She reports losing a small "size of a pencil eraser" amount of "mucous plug" that was green/yellow. She denies s/s of vaginitis, pain. She also reports not feeling the baby move in the past day. Advised patient of normal regarding size/location of uterus, fetal movement, vaginal discharge.    Contractions: Not present. Vag. Bleeding: None.  Movement: (!) Decreased. Leaking Fluid denies.  ----------------------------------------------------------------------------------- The following portions of the patient's history were reviewed and updated as appropriate: allergies, current medications, past family history, past medical history, past social history, past surgical history and problem list. Problem list updated.  Objective  Blood pressure 122/74, weight 219 lb (99.3 kg), last menstrual period 03/23/2020, not currently breastfeeding. Pregravid weight 224 lb (101.6 kg) Total Weight Gain -5 lb (-2.268 kg) Urinalysis: Urine Protein    Urine Glucose    Fetal Status: Fetal Heart Rate (bpm): 152   Movement: (!) Decreased     General:  Alert, oriented and cooperative. Patient is in no acute distress.  Skin: Skin is warm and dry. No rash noted.   Cardiovascular: Normal  heart rate noted  Respiratory: Normal respiratory effort, no problems with respiration noted  Abdomen: Soft, gravid, appropriate for gestational age. Pain/Pressure: Absent     Pelvic:  wnl, scant thin clear discharge        Extremities: Normal range of motion.  Edema: None  Mental Status: Normal mood and affect. Normal behavior. Normal judgment and thought content.   Assessment   20 y.o. G2P1001 at [redacted]w[redacted]d by  12/28/2020, by Last Menstrual Period presenting for work-in prenatal visit  Plan   pregnancy Problems (from 04/27/20 to present)    Problem Noted Resolved   Encounter for supervision of other normal pregnancy, first trimester 04/27/2020 by Zipporah Plants, CNM No   Overview Addendum 05/18/2020  4:26 PM by Vena Austria, MD     Nursing Staff Provider  Office Location  [facility] Dating   LMP = 8 week [place]  Language  English Anatomy US    Flu Vaccine  declined Genetic Screen  NIPS:  desires   TDaP vaccine    Hgb A1C or  GTT Early : Third trimester :   Rhogam   n/a    LAB RESULTS   Feeding Plan  formula Blood Type   O+  Contraception  Antibody   negative  Circumcision  Rubella   immune  Pediatrician   RPR   non-reactive  Support Person  Mother HBsAg  negative  Prenatal Classes  HIV  non-reactive    Varicella  immune  BTL Consent  n/a GBS  (For PCN allergy, check sensitivities)        VBAC Consent  n/a Pap  <21 yo    Hgb Electro   normal hgb    CF      SMA         Pregnancy diagnoses  Pregravid BMI: 35.1 Short  interval pregnancy (last delivered 9/21)      Previous Version       Preterm labor symptoms and general obstetric precautions including but not limited to vaginal bleeding, contractions, leaking of fluid and fetal movement were reviewed in detail with the patient.    Return for scheduled appointment.  Tresea Mall, CNM 07/05/2020 3:53 PM

## 2020-07-05 NOTE — Telephone Encounter (Signed)
Pt called after hour nurse 07/05/20 2:14am c/o has been vomiting x 20 episodes and can't keep anything down; has had diarrhea x20 times also; hasn't felt the baby - thinks she has decreased fetal movement.  After hour nurse adv since only 14wks would be seen in the ED and not L&D.  Pt was called to f/u; pt states she hasn't felt the baby move, thinks her mucus plug is coming out; hasn't tried yet today to eat but is keeping some coke down.  Tx'd to [place] for scheduling.

## 2020-07-11 ENCOUNTER — Other Ambulatory Visit: Payer: Self-pay | Admitting: Obstetrics and Gynecology

## 2020-07-11 DIAGNOSIS — Z3689 Encounter for other specified antenatal screening: Secondary | ICD-10-CM

## 2020-07-27 ENCOUNTER — Encounter: Payer: Medicaid Other | Admitting: Obstetrics

## 2020-07-27 NOTE — Telephone Encounter (Signed)
Pt also c/o sharp pains; adv to be seen; tx'd to [place] for scheduling.

## 2020-07-27 NOTE — Telephone Encounter (Signed)
Patient is calling with Vomiting x 1 week and Diarrhea  starting today. Patient would like someone to call and let her know what she needs to do. Patient wasn't sure if it was viral but has been happening for a week now. Patient report onset on dizziness as well today. No fever. Please advise. Patient has no pcp.

## 2020-08-02 ENCOUNTER — Other Ambulatory Visit: Payer: Self-pay

## 2020-08-02 ENCOUNTER — Ambulatory Visit
Admission: RE | Admit: 2020-08-02 | Discharge: 2020-08-02 | Disposition: A | Discharge status: Home / Self Care | Payer: Medicaid Other | Source: Ambulatory Visit | Attending: Obstetrics and Gynecology | Admitting: Obstetrics and Gynecology

## 2020-08-02 DIAGNOSIS — Z3689 Encounter for other specified antenatal screening: Secondary | ICD-10-CM | POA: Diagnosis present

## 2020-08-03 ENCOUNTER — Encounter: Payer: Medicaid Other | Admitting: Obstetrics and Gynecology

## 2020-08-05 ENCOUNTER — Other Ambulatory Visit: Payer: Self-pay

## 2020-08-05 ENCOUNTER — Ambulatory Visit (INDEPENDENT_AMBULATORY_CARE_PROVIDER_SITE_OTHER): Payer: Medicaid Other | Admitting: Obstetrics and Gynecology

## 2020-08-05 ENCOUNTER — Encounter: Payer: Self-pay | Admitting: Obstetrics and Gynecology

## 2020-08-05 VITALS — BP 120/72 | Wt 223.0 lb

## 2020-08-05 DIAGNOSIS — Z3A19 19 weeks gestation of pregnancy: Secondary | ICD-10-CM

## 2020-08-05 DIAGNOSIS — Z3689 Encounter for other specified antenatal screening: Secondary | ICD-10-CM

## 2020-08-05 DIAGNOSIS — Z3482 Encounter for supervision of other normal pregnancy, second trimester: Secondary | ICD-10-CM

## 2020-08-05 NOTE — Progress Notes (Signed)
Routine Prenatal Care Visit  Subjective  Allison Henson is a 20 y.o. G2P1001 at [redacted]w[redacted]d being seen today for ongoing prenatal care.  She is currently monitored for the following issues for this low-risk pregnancy and has Encounter for supervision of other normal pregnancy, second trimester; Short interval between pregnancies affecting pregnancy in first trimester, antepartum; Maternal obesity, antepartum, first trimester; and BMI 35.0-35.9,adult on their problem list.  ----------------------------------------------------------------------------------- Patient reports no complaints.  Patient with questions regarding EDD due to variation of dates on anatomy scan and dating [place]. Contractions: Not present. Vag. Bleeding: None.  Movement: Present. Denies leaking of fluid.  ----------------------------------------------------------------------------------- The following portions of the patient's history were reviewed and updated as appropriate: allergies, current medications, past family history, past medical history, past social history, past surgical history and problem list. Problem list updated.   Objective  Blood pressure 120/72, weight 223 lb (101.2 kg), last menstrual period 03/23/2020, not currently breastfeeding. Pregravid weight 224 lb (101.6 kg) Total Weight Gain -1 lb (-0.454 kg) Urinalysis:      Fetal Status: Fetal Heart Rate (bpm): 139   Movement: Present     General:  Alert, oriented and cooperative. Patient is in no acute distress.  Skin: Skin is warm and dry. No rash noted.   Cardiovascular: Normal heart rate noted  Respiratory: Normal respiratory effort, no problems with respiration noted  Abdomen: Soft, gravid, appropriate for gestational age. Pain/Pressure: Absent     Pelvic:  Cervical exam deferred        Extremities: Normal range of motion.     ental Status: Normal mood and affect. Normal behavior. Normal judgment and thought content.     Assessment   20 y.o.  G2P1001 at [redacted]w[redacted]d by  12/28/2020, by Last Menstrual Period presenting for routine prenatal visit  Plan   pregnancy Problems (from 04/27/20 to present)    Problem Noted Resolved   Encounter for supervision of other normal pregnancy, second trimester 04/27/2020 by Zipporah Plants, CNM No   Overview Addendum 08/05/2020  2:03 PM by Zipporah Plants, CNM     Nursing Staff Provider  Office Location  [facility] Dating   LMP = 8 week [place]  Language  English Anatomy US   incomplete - f/u order placed  Flu Vaccine  declined Genetic Screen  NIPS:  desires   TDaP vaccine    Hgb A1C or  GTT Early : Third trimester :   Rhogam   n/a    LAB RESULTS   Feeding Plan  formula Blood Type   O+  Contraception  Depo Antibody   negative  Circumcision  Rubella   immune  Pediatrician   RPR   non-reactive  Support Person  Mother HBsAg  negative  Prenatal Classes  Resources provided 08/05/20 HIV  non-reactive    Varicella  immune  BTL Consent  n/a GBS  (For PCN allergy, check sensitivities)        VBAC Consent  n/a Pap  <21 yo    Hgb Electro   normal hgb    CF      SMA         Pregnancy diagnoses  Pregravid BMI: 35.1 Short interval pregnancy (last delivered 9/21)      Previous Version      -Reviewed process for dating pregnancy. 12/28/20 EDD based on LMP c/w with 8 wk [place].  -Reviewed anatomy scan findings - orders placed for f/u in 4 weeks.  Second trimester precautions including but not limited to vaginal  bleeding, contractions, leaking of fluid and fetal movement were reviewed in detail with the patient.    Return in about 4 weeks (around 09/02/2020) for ROB - order placed for anatomy US f/u in 4 weeks at [facility].  Zipporah Plants, CNM, MSN [facility], [facility] 08/05/2020, 2:04 PM

## 2020-09-01 ENCOUNTER — Other Ambulatory Visit: Payer: Self-pay

## 2020-09-01 ENCOUNTER — Ambulatory Visit
Admission: RE | Admit: 2020-09-01 | Discharge: 2020-09-01 | Disposition: A | Discharge status: Home / Self Care | Payer: Medicaid Other | Source: Ambulatory Visit | Attending: Obstetrics and Gynecology | Admitting: Obstetrics and Gynecology

## 2020-09-01 DIAGNOSIS — Z3689 Encounter for other specified antenatal screening: Secondary | ICD-10-CM | POA: Insufficient documentation

## 2020-09-02 ENCOUNTER — Ambulatory Visit (INDEPENDENT_AMBULATORY_CARE_PROVIDER_SITE_OTHER): Payer: Medicaid Other | Admitting: Obstetrics and Gynecology

## 2020-09-02 VITALS — BP 110/80 | Wt 222.0 lb

## 2020-09-02 DIAGNOSIS — Z3482 Encounter for supervision of other normal pregnancy, second trimester: Secondary | ICD-10-CM

## 2020-09-02 DIAGNOSIS — Z3A23 23 weeks gestation of pregnancy: Secondary | ICD-10-CM

## 2020-09-02 DIAGNOSIS — Z131 Encounter for screening for diabetes mellitus: Secondary | ICD-10-CM

## 2020-09-02 NOTE — Progress Notes (Signed)
Routine Prenatal Care Visit  Subjective  Allison Henson is a 20 y.o. G2P1001 at [redacted]w[redacted]d being seen today for ongoing prenatal care.  She is currently monitored for the following issues for this high-risk pregnancy and has Encounter for supervision of other normal pregnancy, second trimester; Short interval between pregnancies affecting pregnancy in first trimester, antepartum; Maternal obesity, antepartum, first trimester; and BMI 35.0-35.9,adult on their problem list.  ----------------------------------------------------------------------------------- Patient reports no complaints.   Contractions: Not present. Vag. Bleeding: None.  Movement: Present. Denies leaking of fluid.  ----------------------------------------------------------------------------------- The following portions of the patient's history were reviewed and updated as appropriate: allergies, current medications, past family history, past medical history, past social history, past surgical history and problem list. Problem list updated.  Objective  Blood pressure 110/80, weight 222 lb (100.7 kg), last menstrual period 03/23/2020, not currently breastfeeding. Pregravid weight 224 lb (101.6 kg) Total Weight Gain -2 lb (-0.907 kg) Urinalysis:      Fetal Status: Fetal Heart Rate (bpm): 137 Fundal Height: 24 cm Movement: Present     General:  Alert, oriented and cooperative. Patient is in no acute distress.  Skin: Skin is warm and dry. No rash noted.   Cardiovascular: Normal heart rate noted  Respiratory: Normal respiratory effort, no problems with respiration noted  Abdomen: Soft, gravid, appropriate for gestational age. Pain/Pressure: Absent     Pelvic:  Cervical exam deferred        Extremities: Normal range of motion.  Edema: None  ental Status: Normal mood and affect. Normal behavior. Normal judgment and thought content.    Assessment   20 y.o. G2P1001 at [redacted]w[redacted]d by  12/28/2020, by Last Menstrual Period presenting  for routine prenatal visit  Plan   pregnancy Problems (from 04/27/20 to present)    Problem Noted Resolved   Encounter for supervision of other normal pregnancy, second trimester 04/27/2020 by Zipporah Plants, CNM No   Overview Addendum 09/02/2020  2:22 PM by Zipporah Plants, CNM     Nursing Staff Provider  Office Location  [facility] Dating   LMP = 8 week [place]  Language  English Anatomy US   incomplete - f/u order placed  Flu Vaccine  declined Genetic Screen  NIPS:  desires   TDaP vaccine    Hgb A1C or  GTT Early : Third trimester :   Rhogam   n/a    LAB RESULTS   Feeding Plan  formula Blood Type   O+  Contraception  Depo vs. BTL Antibody   negative  Circumcision  Rubella   immune  Pediatrician   RPR   non-reactive  Support Person  Mother HBsAg  negative  Prenatal Classes  Resources provided 08/05/20 HIV  non-reactive    Varicella  immune  BTL Consent  n/a GBS  (For PCN allergy, check sensitivities)        VBAC Consent  n/a Pap  <21 yo    Hgb Electro   normal hgb    CF      SMA         Pregnancy diagnoses  Pregravid BMI: 35.1 Short interval pregnancy (last delivered 9/21)      Previous Version      -[place] from yesterday not resulted - plan made with patient to follow-up with report -1h GTT and third trimester labs at next visit  Second trimester precautions including but not limited to vaginal bleeding, contractions, leaking of fluid and fetal movement were reviewed in detail with the patient.  Return in about 4 weeks (around 09/30/2020) for ROB with 1h GTT.  Zipporah Plants, CNM, MSN [facility], [facility] 09/02/2020, 2:23 PM

## 2020-09-09 ENCOUNTER — Ambulatory Visit (INDEPENDENT_AMBULATORY_CARE_PROVIDER_SITE_OTHER): Payer: Medicaid Other | Admitting: Advanced Practice Midwife

## 2020-09-09 ENCOUNTER — Encounter: Payer: Self-pay | Admitting: Advanced Practice Midwife

## 2020-09-09 ENCOUNTER — Other Ambulatory Visit: Payer: Self-pay

## 2020-09-09 VITALS — BP 118/78 | Wt 224.0 lb

## 2020-09-09 DIAGNOSIS — Z3A24 24 weeks gestation of pregnancy: Secondary | ICD-10-CM

## 2020-09-09 DIAGNOSIS — Z3482 Encounter for supervision of other normal pregnancy, second trimester: Secondary | ICD-10-CM

## 2020-09-09 NOTE — Progress Notes (Signed)
Routine Prenatal Care Visit  Subjective  Allison Henson is a 20 y.o. G2P1001 at [redacted]w[redacted]d being seen today for ongoing prenatal care.  She is currently monitored for the following issues for this low-risk pregnancy and has Encounter for supervision of other normal pregnancy, second trimester; Short interval between pregnancies affecting pregnancy in first trimester, antepartum; Maternal obesity, antepartum, first trimester; and BMI 35.0-35.9,adult on their problem list.  ----------------------------------------------------------------------------------- Patient reports "stomach tightening" for several hours yesterday. She had pain lasting for 10 minutes at a time and occurring less than once per hour. She does not drink water- only sprite. We discussed the importance of hydration and avoiding the extra calories from soda. She denies any GI symptoms of nausea, vomiting, diarrhea or constipation.  Preterm precautions reviewed.  Contractions: Not present. Vag. Bleeding: None.  Movement: Present. Leaking Fluid denies.  ----------------------------------------------------------------------------------- The following portions of the patient's history were reviewed and updated as appropriate: allergies, current medications, past family history, past medical history, past social history, past surgical history and problem list. Problem list updated.  Objective  Blood pressure 118/78, weight 224 lb (101.6 kg), last menstrual period 03/23/2020, not currently breastfeeding. Pregravid weight 224 lb (101.6 kg) Total Weight Gain 0 lb (0 kg) Urinalysis: Urine Protein    Urine Glucose    Fetal Status: Fetal Heart Rate (bpm): 141   Movement: Present     General:  Alert, oriented and cooperative. Patient is in no acute distress.  Skin: Skin is warm and dry. No rash noted.   Cardiovascular: Normal heart rate noted  Respiratory: Normal respiratory effort, no problems with respiration noted  Abdomen: Soft,  gravid, appropriate for gestational age. Pain/Pressure: Present     Pelvic:  Cervical exam deferred        Extremities: Normal range of motion.     Mental Status: Normal mood and affect. Normal behavior. Normal judgment and thought content.   Assessment   20 y.o. G2P1001 at [redacted]w[redacted]d by  12/28/2020, by Last Menstrual Period presenting for work-in prenatal visit  Plan   pregnancy Problems (from 04/27/20 to present)    Problem Noted Resolved   Encounter for supervision of other normal pregnancy, second trimester 04/27/2020 by Zipporah Plants, CNM No   Overview Addendum 09/02/2020  2:22 PM by Zipporah Plants, CNM     Nursing Staff Provider  Office Location  [facility] Dating   LMP = 8 week [place]  Language  English Anatomy US   incomplete - f/u order placed  Flu Vaccine  declined Genetic Screen  NIPS:  desires   TDaP vaccine    Hgb A1C or  GTT Early : Third trimester :   Rhogam   n/a    LAB RESULTS   Feeding Plan  formula Blood Type   O+  Contraception  Depo vs. BTL Antibody   negative  Circumcision  Rubella   immune  Pediatrician   RPR   non-reactive  Support Person  Mother HBsAg  negative  Prenatal Classes  Resources provided 08/05/20 HIV  non-reactive    Varicella  immune  BTL Consent  n/a GBS  (For PCN allergy, check sensitivities)        VBAC Consent  n/a Pap  <21 yo    Hgb Electro   normal hgb    CF      SMA         Pregnancy diagnoses  Pregravid BMI: 35.1 Short interval pregnancy (last delivered 9/21)      Previous Version  Preterm labor symptoms and general obstetric precautions including but not limited to vaginal bleeding, contractions, leaking of fluid and fetal movement were reviewed in detail with the patient.   Return for scheduled prenatal appointment.  Tresea Mall, CNM 09/09/2020 9:57 AM

## 2020-09-14 ENCOUNTER — Telehealth: Payer: Self-pay

## 2020-09-14 ENCOUNTER — Encounter: Payer: Self-pay | Admitting: Advanced Practice Midwife

## 2020-09-14 ENCOUNTER — Other Ambulatory Visit: Payer: Self-pay

## 2020-09-14 ENCOUNTER — Observation Stay
Admission: EM | Admit: 2020-09-14 | Discharge: 2020-09-14 | Disposition: A | Discharge status: Home / Self Care | Payer: Medicaid Other | Attending: Obstetrics & Gynecology | Admitting: Obstetrics & Gynecology

## 2020-09-14 DIAGNOSIS — O23592 Infection of other part of genital tract in pregnancy, second trimester: Secondary | ICD-10-CM | POA: Diagnosis not present

## 2020-09-14 DIAGNOSIS — B373 Candidiasis of vulva and vagina: Secondary | ICD-10-CM | POA: Diagnosis not present

## 2020-09-14 DIAGNOSIS — Z3482 Encounter for supervision of other normal pregnancy, second trimester: Secondary | ICD-10-CM

## 2020-09-14 DIAGNOSIS — Z3480 Encounter for supervision of other normal pregnancy, unspecified trimester: Secondary | ICD-10-CM

## 2020-09-14 DIAGNOSIS — Z3A25 25 weeks gestation of pregnancy: Secondary | ICD-10-CM

## 2020-09-14 DIAGNOSIS — Z87891 Personal history of nicotine dependence: Secondary | ICD-10-CM | POA: Diagnosis not present

## 2020-09-14 DIAGNOSIS — B3731 Acute candidiasis of vulva and vagina: Secondary | ICD-10-CM

## 2020-09-14 DIAGNOSIS — O26892 Other specified pregnancy related conditions, second trimester: Secondary | ICD-10-CM | POA: Diagnosis present

## 2020-09-14 LAB — URINALYSIS, COMPLETE (UACMP) WITH MICROSCOPIC
Bilirubin Urine: NEGATIVE
Glucose, UA: NEGATIVE mg/dL
Hgb urine dipstick: NEGATIVE
Ketones, ur: NEGATIVE mg/dL
Nitrite: NEGATIVE
Protein, ur: NEGATIVE mg/dL
Specific Gravity, Urine: 1.023 (ref 1.005–1.030)
pH: 6 (ref 5.0–8.0)

## 2020-09-14 LAB — WET PREP, GENITAL
Clue Cells Wet Prep HPF POC: NONE SEEN
Sperm: NONE SEEN
Trich, Wet Prep: NONE SEEN

## 2020-09-14 MED ORDER — TERCONAZOLE 0.8 % VA CREA: 1.0000 | TOPICAL_CREAM | Freq: Every day | VAGINAL | 0 refills | Status: DC

## 2020-09-14 NOTE — Discharge Summary (Signed)
Physician Final Progress Note  Patient ID: Allison Henson MRN: [id number] DOB/AGE: [birth year] 20 y.o.  Admit date: 09/14/2020 Admitting provider: Tresea Mall, CNM Discharge date: 09/14/2020   Admission Diagnoses: vaginal pain  Discharge Diagnoses:  Active Problems:   Encounter for supervision of other normal pregnancy, second trimester   Labor and delivery, indication for care   Yeast vaginitis   [redacted] weeks gestation of pregnancy   History of Present Illness: The patient is a 19 y.o. female G2P1001 at [redacted]w[redacted]d who presents for vaginal pain that started last night and worsened this afternoon. She reports good fetal movement and denies contractions, vaginal bleeding and leakage of fluid. She denies s/s of urinary tract infection. She denies vaginal itching, discharge or odor.    She has concerns that she could be dilating since this is the pain she felt when dilating with her first pregnancy. She accepts cervical exam. Per anatomy scan, her placenta is posterior and not low lying.  Past Medical History:  Diagnosis Date  . Depression     Past Surgical History:  Procedure Laterality Date  . breast lump aspiration    . nexplanon  2016    No current facility-administered medications on file prior to encounter.   Current Outpatient Medications on File Prior to Encounter  Medication Sig Dispense Refill  . omeprazole (PRILOSEC) 20 MG capsule Take 1 capsule (20 mg total) by mouth daily. 30 capsule 11  . Doxylamine-Pyridoxine (DICLEGIS) 10-10 MG TBEC Take 2 tablets by mouth at bedtime. If symptoms persist, add one tablet in the morning and one in the afternoon (Patient not taking: Reported on 09/14/2020) 100 tablet 5  . ondansetron (ZOFRAN ODT) 4 MG disintegrating tablet Take 1 tablet (4 mg total) by mouth every 6 (six) hours as needed for nausea. (Patient not taking: Reported on 09/14/2020) 60 tablet 3  . sertraline (ZOLOFT) 50 MG tablet TAKE 1 TABLET BY MOUTH EVERY DAY (Patient not taking:  Reported on 09/14/2020) 90 tablet 1    No Known Allergies  Social History   Socioeconomic History  . Marital status: Single    Spouse name: Not on file  . Number of children: Not on file  . Years of education: Not on file  . Highest education level: Not on file  Occupational History  . Not on file  Tobacco Use  . Smoking status: Former Smoker    Types: Cigarettes    Quit date: 2020    Years since quitting: 2.4  . Smokeless tobacco: Never Used  Vaping Use  . Vaping Use: Former  Substance and Sexual Activity  . Alcohol use: Never  . Drug use: Not Currently    Types: Marijuana    Comment: previous marijuana use  . Sexual activity: Yes    Birth control/protection: Surgical  Other Topics Concern  . Not on file  Social History Narrative  . Not on file   Social Determinants of Health   Financial Resource Strain: Not on file  Food Insecurity: Not on file  Transportation Needs: Not on file  Physical Activity: Not on file  Stress: Not on file  Social Connections: Not on file  Intimate Partner Violence: Not on file    History reviewed. No pertinent family history.   Review of Systems  Constitutional: Negative for chills and fever.  HENT: Negative for congestion, ear discharge, ear pain, hearing loss, sinus pain and sore throat.   Eyes: Negative for blurred vision and double vision.  Respiratory: Negative for cough, shortness of  breath and wheezing.   Cardiovascular: Negative for chest pain, palpitations and leg swelling.  Gastrointestinal: Negative for abdominal pain, blood in stool, constipation, diarrhea, heartburn, melena, nausea and vomiting.  Genitourinary: Negative for dysuria, flank pain, frequency, hematuria and urgency.       Positive for vaginal pain  Musculoskeletal: Negative for back pain, joint pain and myalgias.  Skin: Negative for itching and rash.  Neurological: Negative for dizziness, tingling, tremors, sensory change, speech change, focal weakness,  seizures, loss of consciousness, weakness and headaches.  Endo/Heme/Allergies: Negative for environmental allergies. Does not bruise/bleed easily.  Psychiatric/Behavioral: Negative for depression, hallucinations, memory loss, substance abuse and suicidal ideas. The patient is not nervous/anxious and does not have insomnia.      Physical Exam: BP 104/63 (BP Location: Right Arm)   Pulse 81   Temp 98.2 F (36.8 C) (Oral)   Resp 15   LMP 03/23/2020   Constitutional: Well nourished, well developed female in no acute distress.  HEENT: normal Skin: Warm and dry.  Cardiovascular: Regular rate and rhythm.   Extremity: no edema  Respiratory: Clear to auscultation bilateral. Normal respiratory effort Abdomen: FHT present 145  Back: no CVAT Neuro: DTRs 2+, Cranial nerves grossly intact Psych: Alert and Oriented x3. No memory deficits. Normal mood and affect.  MS: normal gait, normal bilateral lower extremity ROM/strength/stability.  Pelvic exam:  is not limited by body habitus EGBUS: within normal limits Vagina: within normal limits and with normal mucosa, thick white discharge Cervix: closed/thick  Toco: negative  Consults: None  Significant Findings/ Diagnostic Studies: labs:  Results for SANYIAH, KANZLER (MRN [id number]) as of 09/14/2020 16:36  Ref. Range 09/14/2020 15:14 09/14/2020 15:29 09/14/2020 15:36  Yeast Wet Prep HPF POC Latest Ref Range: NONE SEEN    PRESENT (A)  Trich, Wet Prep Latest Ref Range: NONE SEEN    NONE SEEN  Clue Cells Wet Prep HPF POC Latest Ref Range: NONE SEEN    NONE SEEN  WBC, Wet Prep HPF POC Latest Ref Range: NONE SEEN    FEW (A)  Appearance Latest Ref Range: CLEAR  HAZY (A)    Bilirubin Urine Latest Ref Range: NEGATIVE  NEGATIVE    Color, Urine Latest Ref Range: YELLOW  YELLOW (A)    Glucose, UA Latest Ref Range: NEGATIVE mg/dL NEGATIVE    Hgb urine dipstick Latest Ref Range: NEGATIVE  NEGATIVE    Ketones, ur Latest Ref Range: NEGATIVE mg/dL NEGATIVE     Leukocytes,Ua Latest Ref Range: NEGATIVE  SMALL (A)    Nitrite Latest Ref Range: NEGATIVE  NEGATIVE    pH Latest Ref Range: 5.0 - 8.0  6.0    Protein Latest Ref Range: NEGATIVE mg/dL NEGATIVE    Specific Gravity, Urine Latest Ref Range: 1.005 - 1.030  1.023    Bacteria, UA Latest Ref Range: NONE SEEN  RARE (A)    Mucus Unknown PRESENT    RBC / HPF Latest Ref Range: 0 - 5 RBC/hpf 0-5    Squamous Epithelial / LPF Latest Ref Range: 0 - 5  6-10    WBC, UA Latest Ref Range: 0 - 5 WBC/hpf 0-5    URINE CULTURE Unknown  Rpt   WET PREP, GENITAL Unknown   Rpt (A)    Procedures: none  Hospital Course: The patient was admitted to Labor and Delivery Triage for observation.   Discharge Condition: good  Disposition: Discharge disposition: 01-Home or Self Care  Diet: Regular diet  Discharge Activity: Activity as tolerated  Discharge  Instructions    Discharge activity:  No Restrictions   Complete by: As directed    Discharge diet:  No restrictions   Complete by: As directed    No sexual activity restrictions   Complete by: As directed      Allergies as of 09/14/2020   No Known Allergies     Medication List    STOP taking these medications   Doxylamine-Pyridoxine 10-10 MG Tbec Commonly known as: Diclegis   ondansetron 4 MG disintegrating tablet Commonly known as: Zofran ODT   sertraline 50 MG tablet Commonly known as: ZOLOFT     TAKE these medications   omeprazole 20 MG capsule Commonly known as: PRILOSEC Take 1 capsule (20 mg total) by mouth daily.   terconazole 0.8 % vaginal cream Commonly known as: TERAZOL 3 Place 1 applicator vaginally at bedtime.       Follow-up Information    [facility]. Go to.   Specialty: Obstetrics and Gynecology Why: scheduled appointment Contact information: [address] [place] [address] [phone]              Total time spent taking care of this patient: less than 25  minutes  Signed: Tresea Mall, CNM  09/14/2020, 4:38 PM

## 2020-09-14 NOTE — OB Triage Note (Signed)
Pt. presents to Labor & Delivery due to vaginal pain rated 2/10 since last night. The pt. states her pain is intermittent, is generally staying about the same in intensity, and is described as generalized discomfort in her vaginal area. FHR obtained as pt. is only 25w gestation. FHR 150bpm. External Toco applied. Vital signs WNL. Will continue to assess.

## 2020-09-14 NOTE — Telephone Encounter (Signed)
Pt calling; doesn't know if she is having growing pains or ctxs; having pain in her area which is the same pain she had with her son and found out she was 3cm dilated.  [phone]  Pt states pain is in the top of her vagina; tried to explain the diff between true ctxs and B-H; pt is convinced it's ctxs and is worried being so early in the preg.  Adv to go to [facility] L&D.  [place] notified.

## 2020-09-16 LAB — URINE CULTURE

## 2020-09-30 ENCOUNTER — Other Ambulatory Visit: Payer: Self-pay

## 2020-09-30 ENCOUNTER — Ambulatory Visit (INDEPENDENT_AMBULATORY_CARE_PROVIDER_SITE_OTHER): Payer: Medicaid Other | Admitting: Obstetrics and Gynecology

## 2020-09-30 VITALS — BP 108/62 | Wt 227.0 lb

## 2020-09-30 DIAGNOSIS — Z3482 Encounter for supervision of other normal pregnancy, second trimester: Secondary | ICD-10-CM

## 2020-09-30 DIAGNOSIS — Z3483 Encounter for supervision of other normal pregnancy, third trimester: Secondary | ICD-10-CM

## 2020-09-30 DIAGNOSIS — O99213 Obesity complicating pregnancy, third trimester: Secondary | ICD-10-CM

## 2020-09-30 DIAGNOSIS — Z131 Encounter for screening for diabetes mellitus: Secondary | ICD-10-CM

## 2020-09-30 LAB — POCT URINALYSIS DIPSTICK OB
Glucose, UA: NEGATIVE
POC,PROTEIN,UA: NEGATIVE

## 2020-09-30 NOTE — Progress Notes (Signed)
No complaints

## 2020-09-30 NOTE — Progress Notes (Signed)
Routine Prenatal Care Visit  Subjective  Allison Henson is a 20 y.o. G2P1001 at [redacted]w[redacted]d being seen today for ongoing prenatal care.  She is currently monitored for the following issues for this high-risk pregnancy and has Encounter for supervision of other normal pregnancy, second trimester; Short interval between pregnancies affecting pregnancy in first trimester, antepartum; Maternal obesity, antepartum, first trimester; BMI 35.0-35.9,adult; and Labor and delivery, indication for care on their problem list.  ----------------------------------------------------------------------------------- Patient reports no complaints.   Contractions: Not present. Vag. Bleeding: None.  Movement: Present. Denies leaking of fluid.  ----------------------------------------------------------------------------------- The following portions of the patient's history were reviewed and updated as appropriate: allergies, current medications, past family history, past medical history, past social history, past surgical history and problem list. Problem list updated.   Objective  Blood pressure 108/62, weight 227 lb (103 kg), last menstrual period 03/23/2020, not currently breastfeeding. Pregravid weight 224 lb (101.6 kg) Total Weight Gain 3 lb (1.361 kg) Urinalysis:      Fetal Status: Fetal Heart Rate (bpm): 140 Fundal Height: 29 cm Movement: Present     General:  Alert, oriented and cooperative. Patient is in no acute distress.  Skin: Skin is warm and dry. No rash noted.   Cardiovascular: Normal heart rate noted  Respiratory: Normal respiratory effort, no problems with respiration noted  Abdomen: Soft, gravid, appropriate for gestational age. Pain/Pressure: Absent     Pelvic:  Cervical exam deferred        Extremities: Normal range of motion.  Edema: None  ental Status: Normal mood and affect. Normal behavior. Normal judgment and thought content.     Assessment   20 y.o. G2P1001 at [redacted]w[redacted]d by   12/28/2020, by Last Menstrual Period presenting for routine prenatal visit  Plan   pregnancy Problems (from 04/27/20 to present)     Problem Noted Resolved   Encounter for supervision of other normal pregnancy, second trimester 04/27/2020 by Zipporah Plants, CNM No   Overview Addendum 09/30/2020  2:50 PM by Zipporah Plants, CNM     Nursing Staff Provider  Office Location  [facility] Dating   LMP = 8 week [place]  Language  English Anatomy US   incomplete - f/u order placed  Flu Vaccine  declined Genetic Screen  NIPS:  Negative x3, XX   TDaP vaccine    Hgb A1C or  GTT Early : 86 Third trimester :   Rhogam   n/a    LAB RESULTS   Feeding Plan  formula Blood Type   O+  Contraception  Depo vs. BTL Antibody   negative  Circumcision  Rubella   immune  Pediatrician   RPR   non-reactive  Support Person  Mother HBsAg  negative  Prenatal Classes  Resources provided 08/05/20 HIV  non-reactive    Varicella  immune  BTL Consent  signed 09/30/20 GBS  (For PCN allergy, check sensitivities)        VBAC Consent  n/a Pap  <21 yo    Hgb Electro   normal hgb    CF      SMA        Pregnancy diagnoses  Pregravid BMI: 35.1 Short interval pregnancy (last delivered 9/21)       Yeast vaginitis 09/14/2020 by Tresea Mall, CNM 09/30/2020 by Zipporah Plants, CNM   [redacted] weeks gestation of pregnancy 09/14/2020 by Tresea Mall, CNM 09/30/2020 by Zipporah Plants, CNM       -1h GTT and third trimester labs today  Preterm obstetric  precautions including but not limited to vaginal bleeding, contractions, leaking of fluid and fetal movement were reviewed in detail with the patient.    Return in about 2 weeks (around 10/14/2020) for ROB.  Zipporah Plants, CNM, MSN [facility], [facility] 09/30/2020, 2:50 PM

## 2020-10-01 LAB — 28 WEEK RH+PANEL
Basophils Absolute: 0 10*3/uL (ref 0.0–0.2)
Basos: 0 %
EOS (ABSOLUTE): 0 10*3/uL (ref 0.0–0.4)
Eos: 0 %
Gestational Diabetes Screen: 135 mg/dL (ref 65–139)
HIV Screen 4th Generation wRfx: NONREACTIVE
Hematocrit: 34 % (ref 34.0–46.6)
Hemoglobin: 11.3 g/dL (ref 11.1–15.9)
Immature Grans (Abs): 0.1 10*3/uL (ref 0.0–0.1)
Immature Granulocytes: 1 %
Lymphocytes Absolute: 1.6 10*3/uL (ref 0.7–3.1)
Lymphs: 17 %
MCH: 28.1 pg (ref 26.6–33.0)
MCHC: 33.2 g/dL (ref 31.5–35.7)
MCV: 85 fL (ref 79–97)
Monocytes Absolute: 0.5 10*3/uL (ref 0.1–0.9)
Monocytes: 6 %
Neutrophils Absolute: 6.9 10*3/uL (ref 1.4–7.0)
Neutrophils: 76 %
Platelets: 202 10*3/uL (ref 150–450)
RBC: 4.02 x10E6/uL (ref 3.77–5.28)
RDW: 13.3 % (ref 11.7–15.4)
RPR Ser Ql: NONREACTIVE
WBC: 9.1 10*3/uL (ref 3.4–10.8)

## 2020-10-03 ENCOUNTER — Telehealth: Payer: Self-pay

## 2020-10-03 NOTE — Telephone Encounter (Signed)
Pt's mom, Reva Bores, calling zoloft isn't working; pt really doesn't want to eat today or go anywhere or do anything; won't answer the phone b/c she doesn't want to talk to mom.  Mom's # [id number]  VM not set up.

## 2020-10-03 NOTE — Telephone Encounter (Addendum)
Allison Henson returned call; states pt has no SI or thoughts of harming baby; pt's deceased sister's birthday was Sat; FOB out of the picture; just several things piling up; is pushing 77m old away; tells [place] to take him in; texted [place] at work saying she may be home when she gets home from work and she may not; was asked where she was going; pt responded "I don't know.  Somewhere".  Allison Henson left work to take care of her.  Allison Henson states pt does have an appt c PCP at 4:30; adv to go to ED; Allison Henson states pt won't talk to them; she'll just stare at them.  [facility] - not sure if they have emergency dept or not.  Adv pt definitely needs to be seen.  Allison Henson will keep 4:30 appt.

## 2020-10-14 ENCOUNTER — Other Ambulatory Visit: Payer: Self-pay

## 2020-10-14 ENCOUNTER — Ambulatory Visit (INDEPENDENT_AMBULATORY_CARE_PROVIDER_SITE_OTHER): Payer: Medicaid Other | Admitting: Obstetrics

## 2020-10-14 VITALS — BP 120/80 | Wt 229.0 lb

## 2020-10-14 DIAGNOSIS — Z3A29 29 weeks gestation of pregnancy: Secondary | ICD-10-CM

## 2020-10-14 DIAGNOSIS — Z3480 Encounter for supervision of other normal pregnancy, unspecified trimester: Secondary | ICD-10-CM

## 2020-10-14 DIAGNOSIS — Z3483 Encounter for supervision of other normal pregnancy, third trimester: Secondary | ICD-10-CM

## 2020-10-14 LAB — POCT URINALYSIS DIPSTICK OB
Glucose, UA: NEGATIVE
POC,PROTEIN,UA: NEGATIVE

## 2020-10-14 NOTE — Progress Notes (Signed)
Routine Prenatal Care Visit  Subjective  Allison Henson is a 20 y.o. G2P1001 at [redacted]w[redacted]d being seen today for ongoing prenatal care.  She is currently monitored for the following issues for this high-risk pregnancy and has Supervision of other normal pregnancy, antepartum; Short interval between pregnancies affecting pregnancy in first trimester, antepartum; Maternal obesity, antepartum, first trimester; BMI 35.0-35.9,adult; and Labor and delivery, indication for care on their problem list.  ----------------------------------------------------------------------------------- Patient reports backache.  She has occasional back strain. Was started on Wellbutrin for depressive symptoms by a PCP. The FOB is unsupportive and not living with her. Contractions: Not present. Vag. Bleeding: Scant.  Movement: Present. Leaking Fluid denies.  ----------------------------------------------------------------------------------- The following portions of the patient's history were reviewed and updated as appropriate: allergies, current medications, past family history, past medical history, past social history, past surgical history and problem list. Problem list updated.  Objective  Blood pressure 120/80, weight 229 lb (103.9 kg), last menstrual period 03/23/2020, not currently breastfeeding. Pregravid weight 224 lb (101.6 kg) Total Weight Gain 5 lb (2.268 kg) Urinalysis: Urine Protein Negative  Urine Glucose Negative  Fetal Status:     Movement: Present     General:  Alert, oriented and cooperative. Patient is in no acute distress.  Skin: Skin is warm and dry. No rash noted.   Cardiovascular: Normal heart rate noted  Respiratory: Normal respiratory effort, no problems with respiration noted  Abdomen: Soft, gravid, appropriate for gestational age. Pain/Pressure: Present     Pelvic:  Cervical exam deferred        Extremities: Normal range of motion.  Edema: None  Mental Status: Normal mood and affect.  Normal behavior. Normal judgment and thought content.   Assessment   20 y.o. G2P1001 at [redacted]w[redacted]d by  12/28/2020, by Last Menstrual Period presenting for routine prenatal visit  Plan   pregnancy Problems (from 04/27/20 to present)    Problem Noted Resolved   Supervision of other normal pregnancy, antepartum 04/27/2020 by Zipporah Plants, CNM No   Overview Addendum 10/14/2020  3:57 PM by Mirna Mires, CNM     Nursing Staff Provider  Office Location  [facility] Dating   LMP = 8 week [place]  Language  English Anatomy US   incomplete - f/u order placed  Flu Vaccine  declined Genetic Screen  NIPS:  Negative x3, XX   TDaP vaccine    Hgb A1C or  GTT Early : 86 Third trimester : 135  Rhogam   n/a    LAB RESULTS   Feeding Plan  formula Blood Type   O+  Contraception  Depo vs. BTL Antibody   negative  Circumcision  Rubella   immune  Pediatrician   RPR   non-reactive  Support Person  Mother HBsAg  negative  Prenatal Classes  Resources provided 08/05/20 HIV  non-reactive    Varicella  immune  BTL Consent  signed 09/30/20 GBS  (For PCN allergy, check sensitivities)        VBAC Consent  n/a Pap  <21 yo    Hgb Electro   normal hgb    CF      SMA         Pregnancy diagnoses  Pregravid BMI: 35.1 Short interval pregnancy (last delivered 9/21)       Yeast vaginitis 09/14/2020 by Tresea Mall, CNM 09/30/2020 by Zipporah Plants, CNM   [redacted] weeks gestation of pregnancy 09/14/2020 by Tresea Mall, CNM 09/30/2020 by Zipporah Plants, CNM       Preterm  labor symptoms and general obstetric precautions including but not limited to vaginal bleeding, contractions, leaking of fluid and fetal movement were reviewed in detail with the patient. Please refer to After Visit Summary for other counseling recommendations.  Maternity belt advised. She has a growth scan in several weeks (high BMI)  Return in about 2 weeks (around 10/28/2020) for return OB.  Mirna Mires, CNM  10/14/2020 4:23 PM

## 2020-10-26 ENCOUNTER — Telehealth: Payer: Self-pay

## 2020-10-26 NOTE — Telephone Encounter (Signed)
Pt called after hour nurse 10/23/20 5:09pm; 30wks; tested positive for Covid; was vaccinated but no boosters; having back pain, leg pain and H/A; pain in back is the worst; no breathing difficulty, SOB or chest pain/pressure; no fever, vomiting or diarrhea; normal po fluid intake and urination; no ctxs, vag bleeding or suspected ROB; dec FM but states that she felt movement last at 4:45pm.  Was adv by MMF, on call, to stay home and monitor sxs; pt is to push po fluid intake, esp sugar-filled or caffeinated drinks for the next few hours while lying on her side in a  quiet room to assess for fetal movement.  Pt is directed to treat pain and or fever with Tylenol; to avoid office visit for next 10 days and to call office for virtual visit if needed.  Directed to seek immediate ED eval for SOB, breathing difficulty or chest pain/pressure.

## 2020-10-27 DIAGNOSIS — U071 COVID-19: Secondary | ICD-10-CM | POA: Insufficient documentation

## 2020-10-27 NOTE — Telephone Encounter (Signed)
Pt's mom calling; states pt is having pain across top of her stomach; lost mucus plug; spotting some.  [phone]  Called pt d/t mom not on DPR. Pt denies IC; is in warm bath and still in pain; pt states spotting with wiping and a spot on underwear.  Adv d/t pain and spotting and no availability in office to go to L&D.  [place] notified.

## 2020-10-28 ENCOUNTER — Ambulatory Visit: Payer: Medicaid Other

## 2020-10-31 ENCOUNTER — Encounter: Payer: Medicaid Other | Admitting: Obstetrics and Gynecology

## 2020-11-07 ENCOUNTER — Ambulatory Visit: Payer: Medicaid Other

## 2020-11-09 ENCOUNTER — Other Ambulatory Visit: Payer: Self-pay

## 2020-11-09 ENCOUNTER — Encounter: Payer: Self-pay | Admitting: Obstetrics and Gynecology

## 2020-11-09 ENCOUNTER — Ambulatory Visit (INDEPENDENT_AMBULATORY_CARE_PROVIDER_SITE_OTHER): Payer: Medicaid Other | Admitting: Obstetrics and Gynecology

## 2020-11-09 VITALS — BP 114/70 | Ht 67.0 in | Wt 228.4 lb

## 2020-11-09 DIAGNOSIS — Z3480 Encounter for supervision of other normal pregnancy, unspecified trimester: Secondary | ICD-10-CM

## 2020-11-09 DIAGNOSIS — Z23 Encounter for immunization: Secondary | ICD-10-CM

## 2020-11-09 DIAGNOSIS — Z3A33 33 weeks gestation of pregnancy: Secondary | ICD-10-CM

## 2020-11-09 NOTE — Progress Notes (Signed)
Routine Prenatal Care Visit  Subjective  Allison Henson is a 20 y.o. G2P1001 at [redacted]w[redacted]d being seen today for ongoing prenatal care.  She is currently monitored for the following issues for this low-risk pregnancy and has Supervision of other normal pregnancy, antepartum; Short interval between pregnancies affecting pregnancy in first trimester, antepartum; Maternal obesity, antepartum, first trimester; BMI 35.0-35.9,adult; and Labor and delivery, indication for care on their problem list.  ----------------------------------------------------------------------------------- Patient reports  irregular contractions. Was seen at [facility], 0.5 cm dilated and did not make cervucal change on repeat exams .   Contractions: Irregular. Vag. Bleeding: None.  Movement: Present. Denies leaking of fluid.  ----------------------------------------------------------------------------------- The following portions of the patient's history were reviewed and updated as appropriate: allergies, current medications, past family history, past medical history, past social history, past surgical history and problem list. Problem list updated.   Objective  Blood pressure 114/70, height 5\' 7"  (1.702 m), weight 228 lb 6.4 oz (103.6 kg), last menstrual period 03/23/2020, not currently breastfeeding. Pregravid weight 224 lb (101.6 kg) Total Weight Gain 4 lb 6.4 oz (1.996 kg) Urinalysis:      Fetal Status: Fetal Heart Rate (bpm): 135 Fundal Height: 35 cm Movement: Present     General:  Alert, oriented and cooperative. Patient is in no acute distress.  Skin: Skin is warm and dry. No rash noted.   Cardiovascular: Normal heart rate noted  Respiratory: Normal respiratory effort, no problems with respiration noted  Abdomen: Soft, gravid, appropriate for gestational age. Pain/Pressure: Absent     Pelvic:  Cervical exam deferred        Extremities: Normal range of motion.  Edema: None  Mental Status: Normal mood and affect.  Normal behavior. Normal judgment and thought content.     Assessment   20 y.o. G2P1001 at [redacted]w[redacted]d by  12/28/2020, by Last Menstrual Period presenting for routine prenatal visit  Plan   pregnancy Problems (from 04/27/20 to present)     Problem Noted Resolved   Supervision of other normal pregnancy, antepartum 04/27/2020 by 06/25/2020, CNM No   Overview Addendum 11/09/2020  3:32 PM by 11/11/2020, MD     Nursing Staff Provider  Office Location  [facility] Dating   LMP = 8 week Natale Milch  Language  English Anatomy US   incomplete - f/u order placed  Flu Vaccine  declined Genetic Screen  NIPS:  Negative x3, XX   TDaP vaccine   11/09/2020 Hgb A1C or  GTT Early : 86 Third trimester : 135  Rhogam   n/a    LAB RESULTS   Feeding Plan  formula Blood Type   O+  Contraception  Depo vs. BTL Antibody   negative  Circumcision  Rubella   immune  Pediatrician   RPR   non-reactive  Support Person  Mother HBsAg  negative  Prenatal Classes  Resources provided 08/05/20 HIV  non-reactive    Varicella  immune  BTL Consent  signed 09/30/20 GBS  (For PCN allergy, check sensitivities)        VBAC Consent  n/a Pap  <21 yo    Hgb Electro   normal hgb    CF      SMA        Pregnancy diagnoses  Pregravid BMI: 35.1 Short interval pregnancy (last delivered 9/21)      Yeast vaginitis 09/14/2020 by 11/14/2020, CNM 09/30/2020 by 10/02/2020, CNM   [redacted] weeks gestation of pregnancy 09/14/2020 by 11/14/2020, CNM 09/30/2020  by Zipporah Plants, CNM       TDAP today  Gestational age appropriate obstetric precautions including but not limited to vaginal bleeding, contractions, leaking of fluid and fetal movement were reviewed in detail with the patient.    Return in about 2 weeks (around 11/23/2020) for ROB in person.  Natale Milch MD [facility], [facility] 11/09/2020, 3:32 PM

## 2020-11-09 NOTE — Patient Instructions (Signed)

## 2020-11-12 ENCOUNTER — Observation Stay: Payer: Medicaid Other

## 2020-11-12 ENCOUNTER — Observation Stay
Admission: EM | Admit: 2020-11-12 | Discharge: 2020-11-12 | Disposition: A | Discharge status: Home / Self Care | Payer: Medicaid Other | Attending: Obstetrics and Gynecology | Admitting: Obstetrics and Gynecology

## 2020-11-12 ENCOUNTER — Encounter: Payer: Self-pay | Admitting: Obstetrics and Gynecology

## 2020-11-12 ENCOUNTER — Other Ambulatory Visit: Payer: Self-pay

## 2020-11-12 DIAGNOSIS — Z3A33 33 weeks gestation of pregnancy: Secondary | ICD-10-CM | POA: Diagnosis not present

## 2020-11-12 DIAGNOSIS — Z0371 Encounter for suspected problem with amniotic cavity and membrane ruled out: Secondary | ICD-10-CM

## 2020-11-12 DIAGNOSIS — O4193X1 Disorder of amniotic fluid and membranes, unspecified, third trimester, fetus 1: Secondary | ICD-10-CM | POA: Diagnosis not present

## 2020-11-12 DIAGNOSIS — O99333 Smoking (tobacco) complicating pregnancy, third trimester: Secondary | ICD-10-CM | POA: Diagnosis not present

## 2020-11-12 DIAGNOSIS — F17210 Nicotine dependence, cigarettes, uncomplicated: Secondary | ICD-10-CM | POA: Diagnosis not present

## 2020-11-12 DIAGNOSIS — O4290 Premature rupture of membranes, unspecified as to length of time between rupture and onset of labor, unspecified weeks of gestation: Secondary | ICD-10-CM | POA: Diagnosis present

## 2020-11-12 DIAGNOSIS — Z3480 Encounter for supervision of other normal pregnancy, unspecified trimester: Secondary | ICD-10-CM

## 2020-11-12 HISTORY — DX: Gastro-esophageal reflux disease without esophagitis: K21.9

## 2020-11-12 LAB — RUPTURE OF MEMBRANE (ROM)PLUS: Rom Plus: NEGATIVE

## 2020-11-12 NOTE — OB Triage Note (Signed)
Woke up in a puddle of liquid. Allison Henson

## 2020-11-12 NOTE — Discharge Summary (Signed)
Physician Discharge Summary  Patient ID: Allison Henson MRN: [id number] DOB/AGE: 03-Aug-2000 20 y.o.  Admit date: 11/12/2020 Discharge date: 11/12/2020  Admission Diagnoses:  Discharge Diagnoses:  Active Problems:   Indication for care in labor or delivery   Leakage of amniotic fluid   Discharged Condition: good  Hospital Course: She presented to L&D reporting that she woke up in a puddle of fluid. She denies contraction. Evaluation for rupture of membranes was negative. ROM plus negative. No ferning seen. AFI normal on [place]. Patient discharged home in stable condition.   Consults: None  Significant Diagnostic Studies: labs: see epic  Treatments : none  Discharge Exam: Blood pressure 119/75, pulse (!) 101, temperature 98.2 F (36.8 C), temperature source Oral, resp. rate 16, height 5\' 7"  (1.702 m), weight 103.4 kg, last menstrual period 03/23/2020, not currently breastfeeding. General appearance: alert Cardio: regular rate and rhythm, S1, S2 normal, no murmur, click, rub or gallop Extremities: extremities normal, atraumatic, no cyanosis or edema Skin: Skin color, texture, turgor normal. No rashes or lesions  Disposition: Discharge disposition: 01-Home or Self Care        Allergies as of 11/12/2020   No Known Allergies      Medication List     TAKE these medications    buPROPion 75 MG tablet Commonly known as: WELLBUTRIN Take 75 mg by mouth 2 (two) times daily.   omeprazole 20 MG capsule Commonly known as: PRILOSEC Take 20 mg by mouth daily.         Signed: 11/14/2020 11/12/2020, 2:37 PM

## 2020-11-12 NOTE — Progress Notes (Signed)
Discharge home. Left floor ambulatory with mother. Allison Henson S  

## 2020-11-13 NOTE — H&P (Signed)
Flavia Bruss Saperstein is an 20 y.o. female.   Chief Complaint: Triage visit for leakage of fluid HPI: She reports waking up in a puddle of fluid.   Past Medical History:  Diagnosis Date   Depression    GERD (gastroesophageal reflux disease)     Past Surgical History:  Procedure Laterality Date   breast lump aspiration     nexplanon  2016    History reviewed. No pertinent family history. Social History:  reports that she has been smoking cigarettes. She has been smoking an average of .25 packs per day. She has never used smokeless tobacco. She reports previous drug use. Drug: Marijuana. She reports that she does not drink alcohol.  Allergies: No Known Allergies  No medications prior to admission.    Results for orders placed or performed during the hospital encounter of 11/12/20 (from the past 48 hour(s))  ROM Plus ([facility] only)     Status: None   Collection Time: 11/12/20 12:20 PM  Result Value Ref Range   Rom Plus NEGATIVE     Comment: Performed at [facility], [address] [place], [place] [address]   [place] [employer]  Result Date: 11/12/2020 CLINICAL DATA:  Unsuspected rupture of membranes. EXAM: LIMITED OBSTETRIC ULTRASOUND COMPARISON:  None. FINDINGS: Number of Fetuses: 1 Heart Rate:  133 bpm Movement: Yes Presentation: Cephalic Placental Location: Posterior Previa: No Amniotic Fluid (Subjective):  Within normal limits. AFI: 11.3 cm BPD: 8.21 cm 33 w  0 d MATERNAL FINDINGS: Cervix: The cervix measures 3 cm in length. Evaluation is otherwise somewhat difficult as the patient refused endovaginal imaging. However, the cervix is not obviously open. Uterus/Adnexae: No abnormality visualized. IMPRESSION: 1. Single live IUP. 2. The patient's AFI is 11.3 cm. 3. The cervix evaluation is limited due to lack of endovaginal imaging. The cervix measures 3 cm in length and the cervix appears to be closed. 4. No other abnormalities. This exam is performed on an emergent basis and does  not comprehensively evaluate fetal size, dating, or anatomy; follow-up complete OB US should be considered if further fetal assessment is warranted. Electronically Signed   By: Gerome Sam III M.D   On: 11/12/2020 14:40    Review of Systems  Blood pressure 119/75, pulse (!) 101, temperature 98.2 F (36.8 C), temperature source Oral, resp. rate 16, height 5\' 7"  (1.702 m), weight 103.4 kg, last menstrual period 03/23/2020, not currently breastfeeding. Physical Exam   Assessment/Plan Evaluation for ROM negative, discharged home  14/11/2019, MD 11/13/2020, 9:27 AM

## 2020-11-15 ENCOUNTER — Observation Stay
Admission: EM | Admit: 2020-11-15 | Discharge: 2020-11-15 | Disposition: A | Discharge status: Home / Self Care | Payer: Medicaid Other | Attending: Obstetrics and Gynecology | Admitting: Obstetrics and Gynecology

## 2020-11-15 ENCOUNTER — Other Ambulatory Visit: Payer: Self-pay

## 2020-11-15 ENCOUNTER — Encounter: Payer: Self-pay | Admitting: Obstetrics and Gynecology

## 2020-11-15 DIAGNOSIS — Z3480 Encounter for supervision of other normal pregnancy, unspecified trimester: Secondary | ICD-10-CM

## 2020-11-15 DIAGNOSIS — Z3A33 33 weeks gestation of pregnancy: Secondary | ICD-10-CM | POA: Diagnosis not present

## 2020-11-15 DIAGNOSIS — O368130 Decreased fetal movements, third trimester, not applicable or unspecified: Secondary | ICD-10-CM | POA: Diagnosis present

## 2020-11-15 DIAGNOSIS — O368190 Decreased fetal movements, unspecified trimester, not applicable or unspecified: Secondary | ICD-10-CM | POA: Diagnosis present

## 2020-11-15 NOTE — Discharge Instructions (Signed)

## 2020-11-15 NOTE — Discharge Summary (Signed)
Physician Final Progress Note  Patient ID: Allison Henson MRN: [id number] DOB/AGE: [birth year] 20 y.o.  Admit date: 11/15/2020 Admitting provider: Vena Austria, MD Discharge date: 11/15/2020   Admission Diagnoses: Decreased fetal movement  Discharge Diagnoses:  Active Problems:   Decreased fetal movement  20 y.o. G2P1001 at [redacted]w[redacted]d by Estimated Date of Delivery: 12/28/20 presenting with decreased fetal movement.  Reactive NST on admission.  Is reporting occasional contractions.  Cervix remains unchanged at 1/thick/high.  No LOF, no VB.    pregnancy Problems (from 04/27/20 to present)     Problem Noted Resolved   Supervision of other normal pregnancy, antepartum 04/27/2020 by Zipporah Plants, CNM No   Overview Addendum 11/09/2020  3:32 PM by Natale Milch, MD     Nursing Staff Provider  Office Location  [facility] Dating   LMP = 8 week [place]  Language  English Anatomy US   incomplete - f/u order placed  Flu Vaccine  declined Genetic Screen  NIPS:  Negative x3, XX   TDaP vaccine   11/09/2020 Hgb A1C or  GTT Early : 86 Third trimester : 135  Rhogam   n/a    LAB RESULTS   Feeding Plan  formula Blood Type   O+  Contraception  Depo vs. BTL Antibody   negative  Circumcision  Rubella   immune  Pediatrician   RPR   non-reactive  Support Person  Mother HBsAg  negative  Prenatal Classes  Resources provided 08/05/20 HIV  non-reactive    Varicella  immune  BTL Consent  signed 09/30/20 GBS  (For PCN allergy, check sensitivities)        VBAC Consent  n/a Pap  <21 yo    Hgb Electro   normal hgb    CF      SMA        Pregnancy diagnoses  Pregravid BMI: 35.1 Short interval pregnancy (last delivered 9/21)      Yeast vaginitis 09/14/2020 by Tresea Mall, CNM 09/30/2020 by Zipporah Plants, CNM   [redacted] weeks gestation of pregnancy 09/14/2020 by Tresea Mall, CNM 09/30/2020 by Zipporah Plants, CNM      Blood pressure 116/71, pulse (!) 109, temperature 99.4 F (37.4 C), temperature source  Axillary, last menstrual period 03/23/2020, not currently breastfeeding.  Consults: None  Significant Findings/ Diagnostic Studies: none  Procedures:  Baseline: 150 Variability: moderate Accelerations: present Decelerations: absent Tocometry: none The patient was monitored for 30 minutes, fetal heart rate tracing was deemed reactive, category I tracing,  CPT [id number]   Discharge Condition: good  Disposition: Discharge disposition: 01-Home or Self Care       Diet: Regular diet  Discharge Activity: Activity as tolerated  Discharge Instructions     Discharge activity:  No Restrictions   Complete by: As directed    Discharge diet:  No restrictions   Complete by: As directed    Fetal Kick Count:  Lie on our left side for one hour after a meal, and count the number of times your baby kicks.  If it is less than 5 times, get up, move around and drink some juice.  Repeat the test 30 minutes later.  If it is still less than 5 kicks in an hour, notify your doctor.   Complete by: As directed    LABOR:  When conractions begin, you should start to time them from the beginning of one contraction to the beginning  of the next.  When contractions are 5 - 10 minutes apart or less  and have been regular for at least an hour, you should call your health care provider.   Complete by: As directed    No sexual activity restrictions   Complete by: As directed    Notify physician for bleeding from the vagina   Complete by: As directed    Notify physician for blurring of vision or spots before the eyes   Complete by: As directed    Notify physician for chills or fever   Complete by: As directed    Notify physician for fainting spells, "black outs" or loss of consciousness   Complete by: As directed    Notify physician for increase in vaginal discharge   Complete by: As directed    Notify physician for leaking of fluid   Complete by: As directed    Notify physician for pain or burning when  urinating   Complete by: As directed    Notify physician for pelvic pressure (sudden increase)   Complete by: As directed    Notify physician for severe or continued nausea or vomiting   Complete by: As directed    Notify physician for sudden gushing of fluid from the vagina (with or without continued leaking)   Complete by: As directed    Notify physician for sudden, constant, or occasional abdominal pain   Complete by: As directed    Notify physician if baby moving less than usual   Complete by: As directed       Allergies as of 11/15/2020   No Known Allergies      Medication List     TAKE these medications    buPROPion 75 MG tablet Commonly known as: WELLBUTRIN Take 75 mg by mouth 2 (two) times daily.   omeprazole 20 MG capsule Commonly known as: PRILOSEC Take 20 mg by mouth daily.         Total time spent taking care of this patient: 30 minutes  Signed: Vena Austria 11/15/2020, 8:59 PM

## 2020-11-15 NOTE — OB Triage Note (Signed)
Pt G2P1 [redacted]w[redacted]d presents to [facility] w/ c/o ctx for several days and decreased fetal movement. Reports no vag bleeding and no LOF. States she has some vaginal pressure that started today and vaginal swelling. Additionally c/o feeling the need to make a bowel movement but unable. Monitors applied and assessing. VSS.

## 2020-11-15 NOTE — OB Triage Note (Signed)
Pt discharged home in stable condition. RN provided discharge instructions to pt, including information on when to come back. Pt verbalized understanding and all questions answered at this time.  

## 2020-11-17 ENCOUNTER — Telehealth: Payer: Self-pay

## 2020-11-17 NOTE — Telephone Encounter (Signed)
Pt aware.  Pt states she is having chest pain and SOB; adv to go to ED;  pt also believes is possibly leaking fluid.  Adv to be sure to mention that at the ED as well.  Pt states she is waiting on her parents to come and take her son to the doctor then she is going to go to the ED in [facility].

## 2020-11-17 NOTE — Telephone Encounter (Signed)
Pt's mom calling; pt took welbutrin and flagyl together and now she feels funny; is it okay to take these meds together?  [phone] (mom)

## 2020-11-17 NOTE — Telephone Encounter (Signed)
Yes fine to take together

## 2020-11-25 ENCOUNTER — Encounter: Payer: Self-pay | Admitting: Advanced Practice Midwife

## 2020-11-25 ENCOUNTER — Ambulatory Visit (INDEPENDENT_AMBULATORY_CARE_PROVIDER_SITE_OTHER): Payer: Medicaid Other | Admitting: Advanced Practice Midwife

## 2020-11-25 ENCOUNTER — Other Ambulatory Visit: Payer: Self-pay

## 2020-11-25 VITALS — BP 114/60 | Wt 226.0 lb

## 2020-11-25 DIAGNOSIS — Z3A35 35 weeks gestation of pregnancy: Secondary | ICD-10-CM

## 2020-11-25 DIAGNOSIS — Z3483 Encounter for supervision of other normal pregnancy, third trimester: Secondary | ICD-10-CM

## 2020-11-25 NOTE — Addendum Note (Signed)
Addended by: Donnetta Hail on: 11/25/2020 04:22 PM   Modules accepted: Orders

## 2020-11-25 NOTE — Progress Notes (Signed)
Routine Prenatal Care Visit  Subjective  Allison Henson is a 20 y.o. G2P1001 at [redacted]w[redacted]d being seen today for ongoing prenatal care.  She is currently monitored for the following issues for this low-risk pregnancy and has Supervision of other normal pregnancy, antepartum; Short interval between pregnancies affecting pregnancy in first trimester, antepartum; Maternal obesity, antepartum, first trimester; BMI 35.0-35.9,adult; Indication for care in labor or delivery; Decreased fetal movement; and COVID-19 on their problem list.  ----------------------------------------------------------------------------------- Patient reports being seen a week ago at [facility]. She was diagnosed with gestational hypertension and had normal PIH labs. They have recommended she be induced at 37 weeks and she will be seeing them for NSTs until then. She plans to deliver there due to childcare issues.    Contractions: Not present. Vag. Bleeding: None.  Movement: Present. Leaking Fluid denies.  ----------------------------------------------------------------------------------- The following portions of the patient's history were reviewed and updated as appropriate: allergies, current medications, past family history, past medical history, past social history, past surgical history and problem list. Problem list updated.  Objective  Blood pressure 114/60, weight 226 lb (102.5 kg), last menstrual period 03/23/2020 Pregravid weight 224 lb (101.6 kg) Total Weight Gain 2 lb (0.907 kg) Urinalysis: Urine Protein    Urine Glucose    Fetal Status: Fetal Heart Rate (bpm): 130 Fundal Height: 36 cm Movement: Present     General:  Alert, oriented and cooperative. Patient is in no acute distress.  Skin: Skin is warm and dry. No rash noted.   Cardiovascular: Normal heart rate noted  Respiratory: Normal respiratory effort, no problems with respiration noted  Abdomen: Soft, gravid, appropriate for gestational age.  Pain/Pressure: Absent     Pelvic:  Cervical exam deferred        Extremities: Normal range of motion.  Edema: None  Mental Status: Normal mood and affect. Normal behavior. Normal judgment and thought content.   Assessment   20 y.o. G2P1001 at [redacted]w[redacted]d by  12/28/2020, by Last Menstrual Period presenting for routine prenatal visit  Plan   pregnancy Problems (from 04/27/20 to present)    Problem Noted Resolved   Supervision of other normal pregnancy, antepartum 04/27/2020 by Zipporah Plants, CNM No   Overview Addendum 11/09/2020  3:32 PM by Natale Milch, MD     Nursing Staff Provider  Office Location  [facility] Dating   LMP = 8 week [place]  Language  English Anatomy US   incomplete - f/u order placed  Flu Vaccine  declined Genetic Screen  NIPS:  Negative x3, XX   TDaP vaccine   11/09/2020 Hgb A1C or  GTT Early : 86 Third trimester : 135  Rhogam   n/a    LAB RESULTS   Feeding Plan  formula Blood Type   O+  Contraception  Depo vs. BTL Antibody   negative  Circumcision  Rubella   immune  Pediatrician   RPR   non-reactive  Support Person  Mother HBsAg  negative  Prenatal Classes  Resources provided 08/05/20 HIV  non-reactive    Varicella  immune  BTL Consent  signed 09/30/20 GBS  (For PCN allergy, check sensitivities)        VBAC Consent  n/a Pap  <21 yo    Hgb Electro   normal hgb    CF      SMA         Pregnancy diagnoses  Pregravid BMI: 35.1 Short interval pregnancy (last delivered 9/21)      Yeast vaginitis 09/14/2020 by  Tresea Mall, CNM 09/30/2020 by Zipporah Plants, CNM   [redacted] weeks gestation of pregnancy 09/14/2020 by Tresea Mall, CNM 09/30/2020 by Zipporah Plants, CNM       Preterm labor symptoms and general obstetric precautions including but not limited to vaginal bleeding, contractions, leaking of fluid and fetal movement were reviewed in detail with the patient. Please refer to After Visit Summary for other counseling recommendations.   Return in about 1 week (around  12/02/2020) for rob.  Tresea Mall, CNM 11/25/2020 3:32 PM

## 2020-11-25 NOTE — Progress Notes (Signed)
ROB- no concerns 

## 2020-11-29 ENCOUNTER — Encounter: Payer: Medicaid Other | Admitting: Obstetrics and Gynecology

## 2021-03-02 IMAGING — US US OB < 14 WEEKS - US OB TV
1 series · 14 of 28 positions shown · non-contrast
Comparison: 06/02/2019

CLINICAL DATA: Vaginal bleeding in first trimester, quantitative
beta HCG [DATE], gravida 1 para 0 portion 0

EXAM:
OBSTETRIC <14 WK ULTRASOUND
TECHNIQUE: Transabdominal ultrasound was performed for evaluation of the
gestation as well as the maternal uterus and adnexal regions.

[Series 1: us ob < 14 weeks - us ob tv · 14 of 113 slices shown]
[im 5/113]
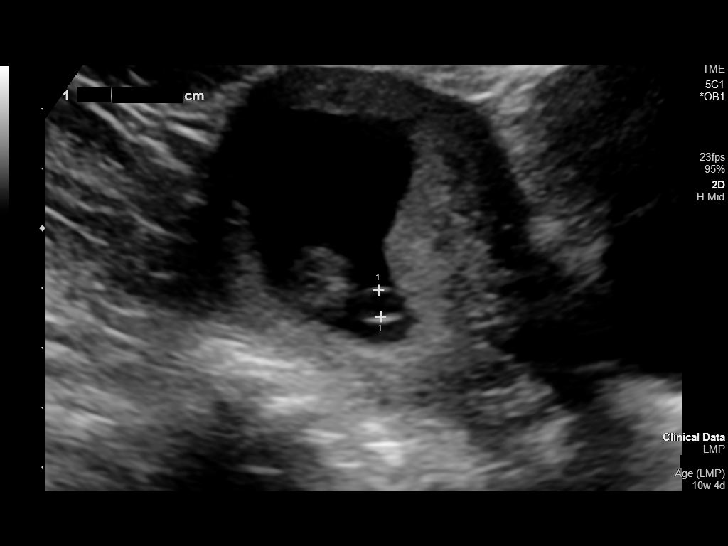
[im 13/113]
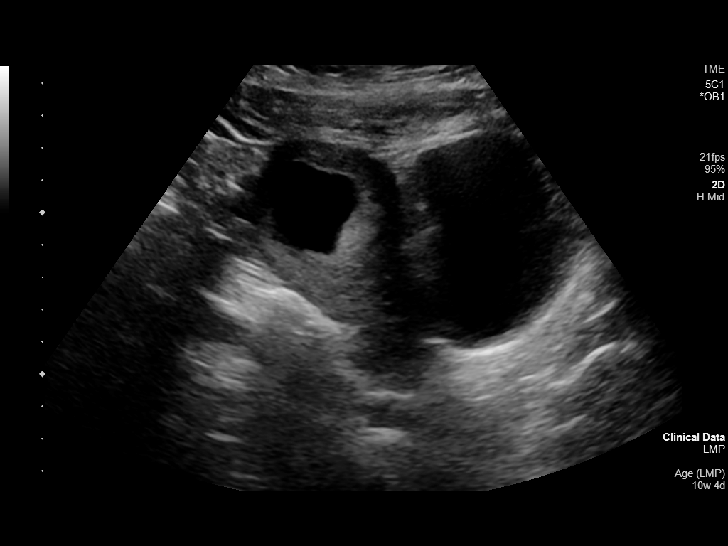
[im 21/113]
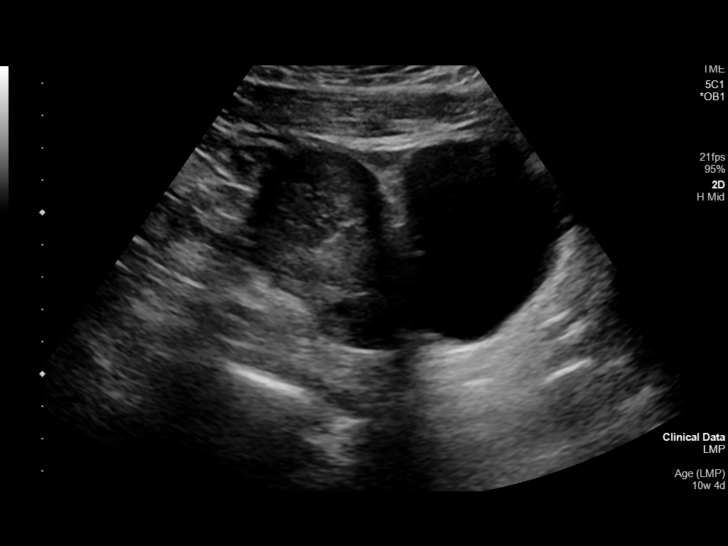
[im 30/113]
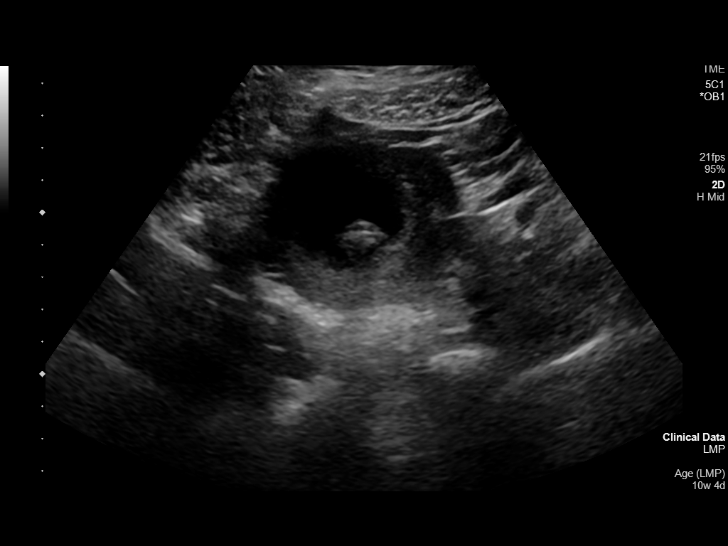
[im 38/113]
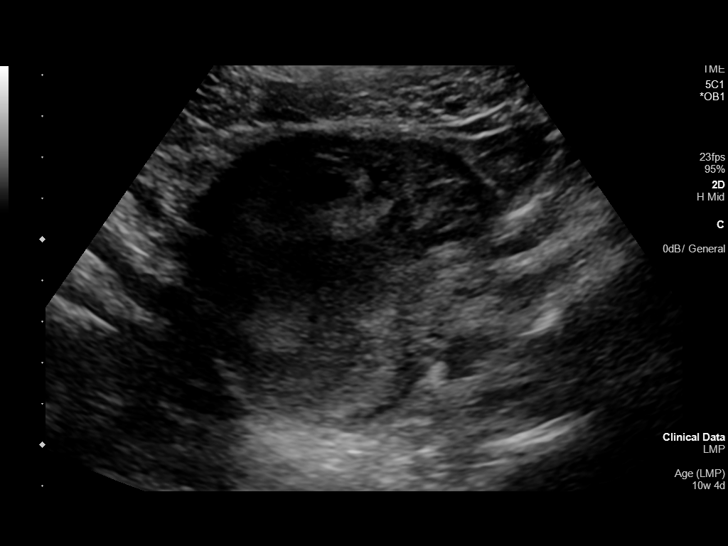
[im 46/113]
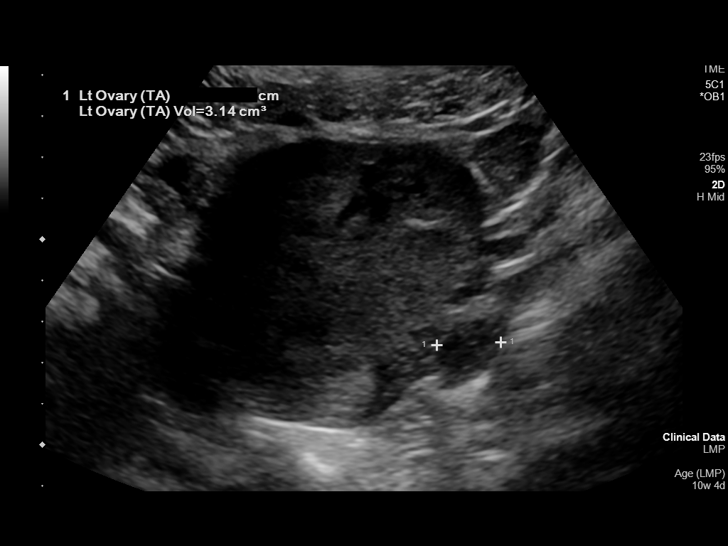
[im 54/113]
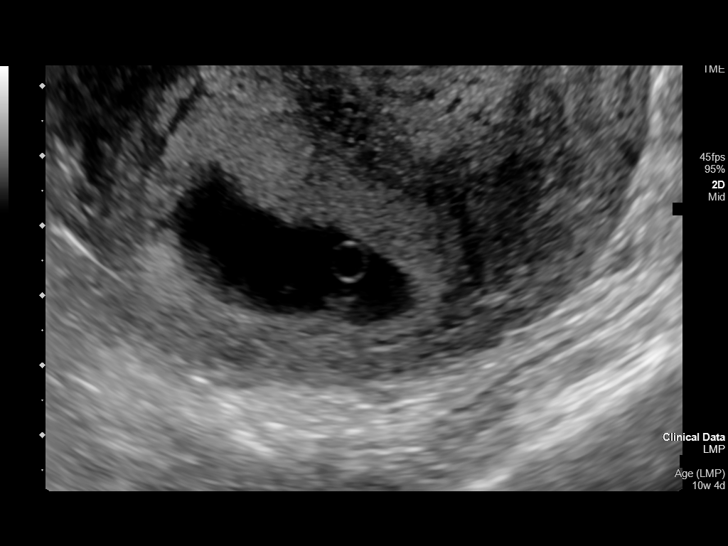
[im 63/113]
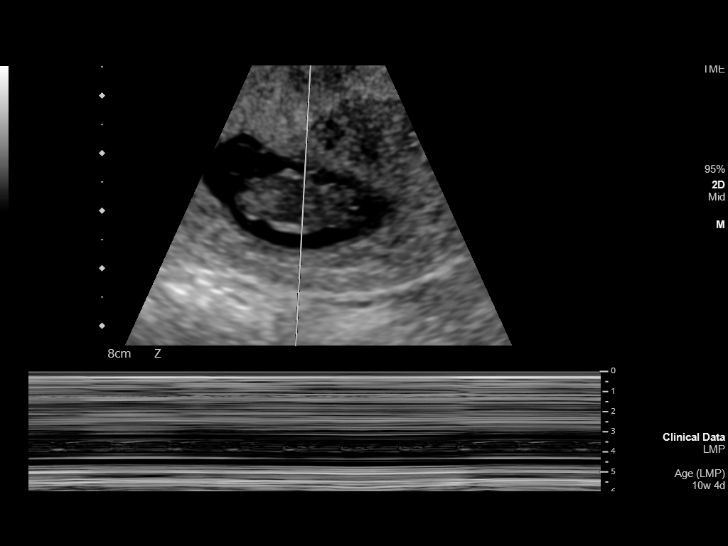
[im 71/113]
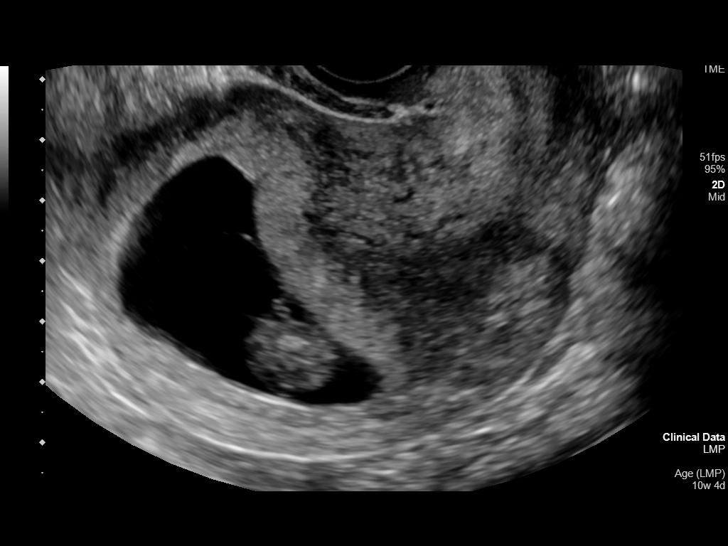
[im 79/113]
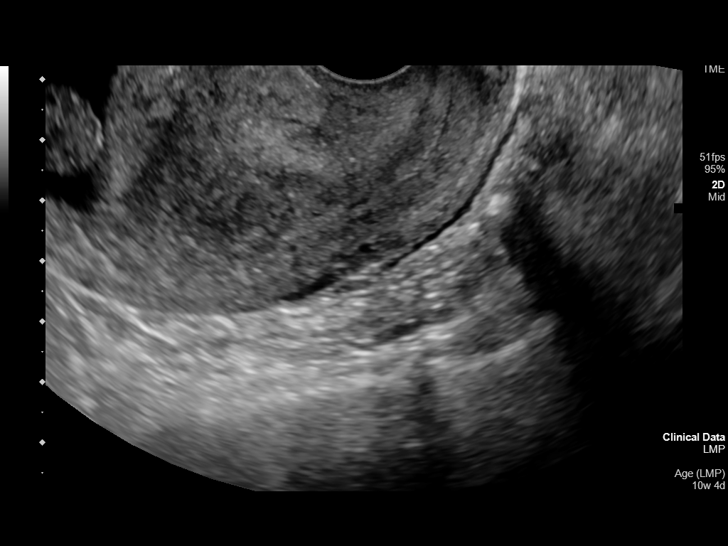
[im 88/113]
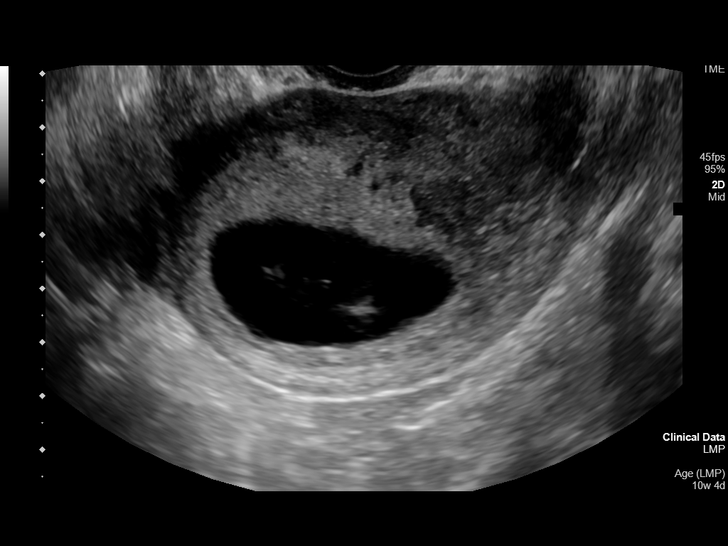
[im 96/113]
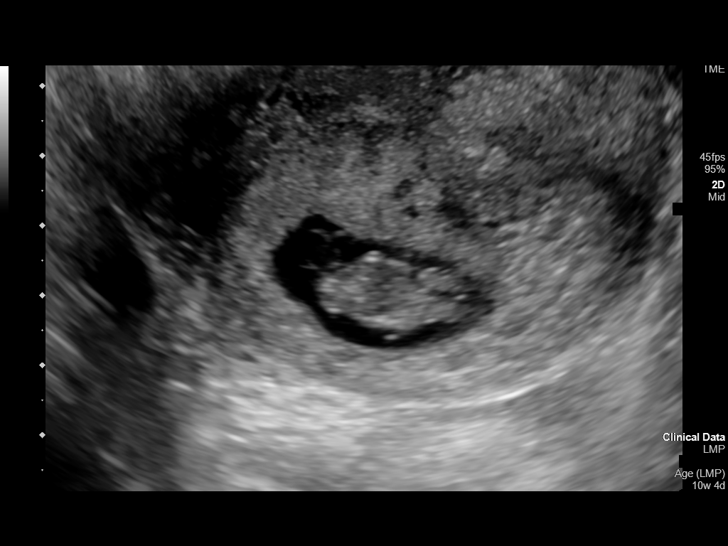
[im 104/113]
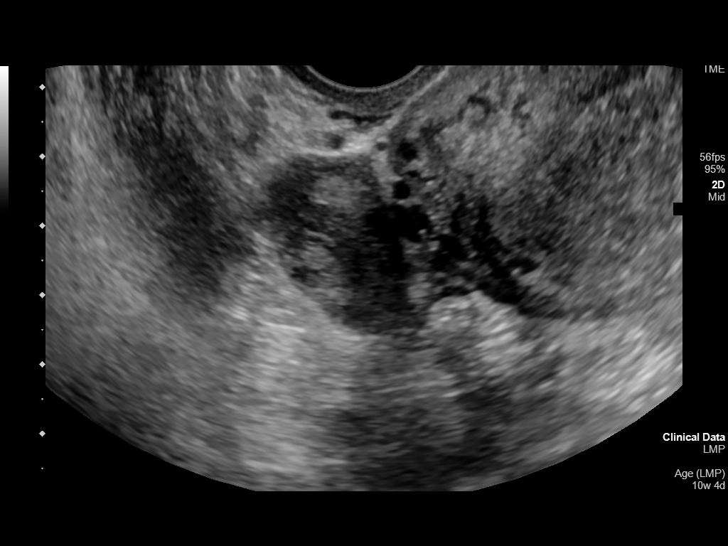
[im 113/113]
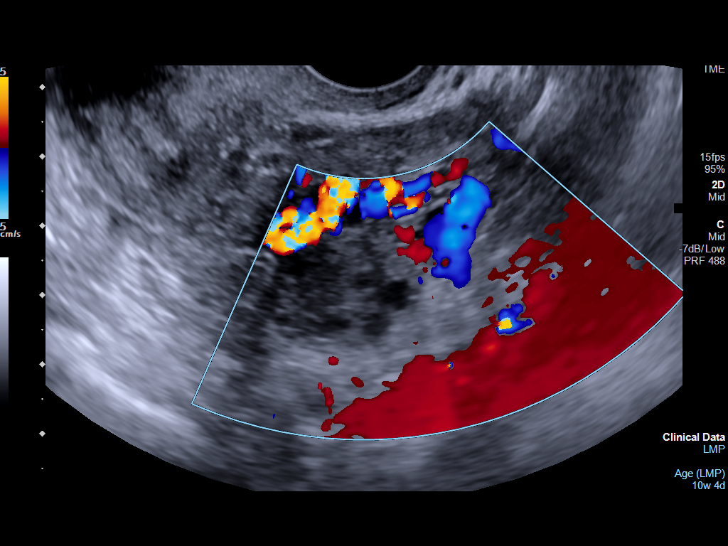

[14 of 28 positions shown; findings below may reference images not displayed]

FINDINGS: Intrauterine gestational sac: Single

Yolk sac:  Visualized.

Embryo:  Visualized.

Cardiac Activity: Visualized.

Heart Rate: 177 bpm

CRL:   28 mm   9 w 4 d                  US EDC: 01/06/2020

Subchorionic hemorrhage:  None visualized.

Maternal uterus/adnexae: Uterus is anteverted. Cervix appears
closed.

Right ovary measures 3.6 x 2.2 x 1.8 cm and the left ovary measures
3.1 x 1.5 x 2.1 cm. Both ovaries demonstrate normal color flow. No
free fluid.
IMPRESSION: 1. Single live intrauterine pregnancy as above, estimated age 9
weeks and 4 days.

## 2021-08-07 ENCOUNTER — Other Ambulatory Visit: Payer: Self-pay | Admitting: Advanced Practice Midwife

## 2022-04-28 IMAGING — US US OB COMP +14 WK
1 series · 13 of 28 positions shown · non-contrast
Comparison: none

CLINICAL DATA: Second trimester pregnancy for fetal anatomy survey.

EXAM:
OBSTETRICAL ULTRASOUND >14 WKS

[Series 1: us ob comp + 14 wk · 13 of 73 slices shown]
[im 3/73]
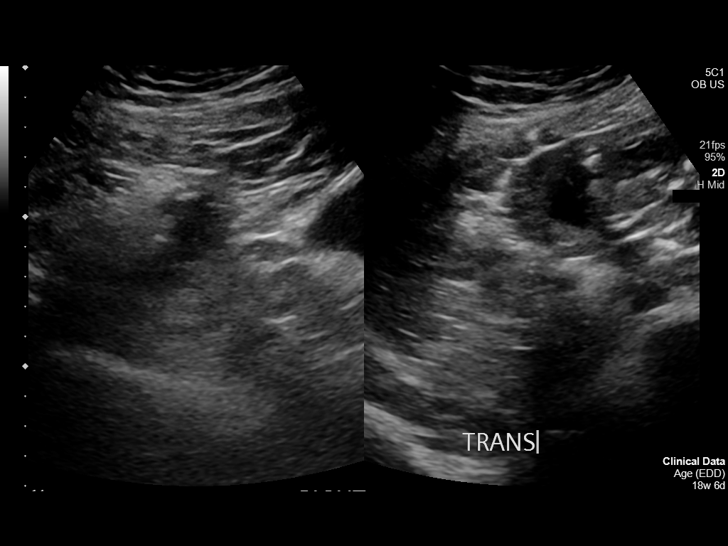
[im 9/73]
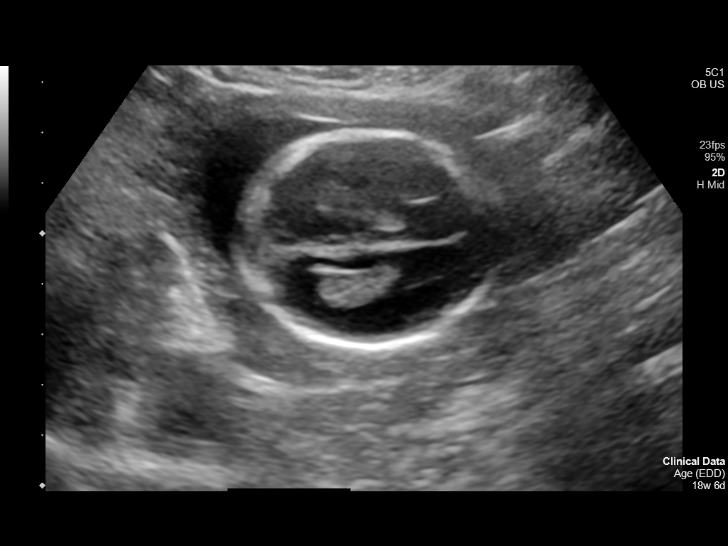
[im 14/73]
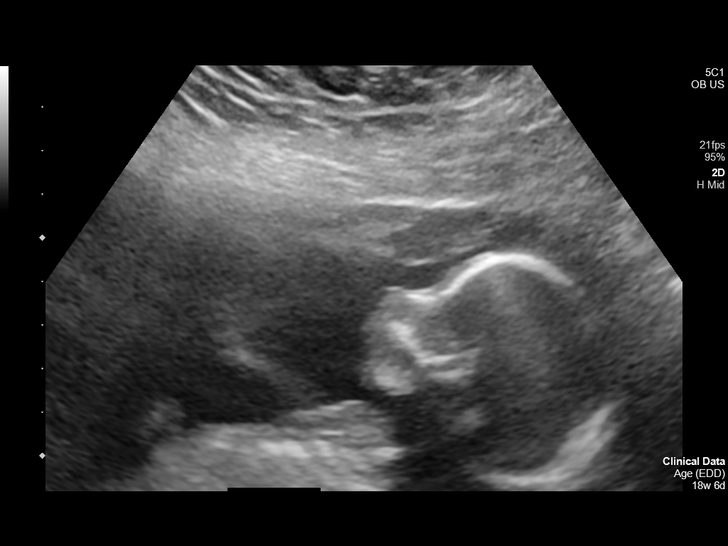
[im 19/73]
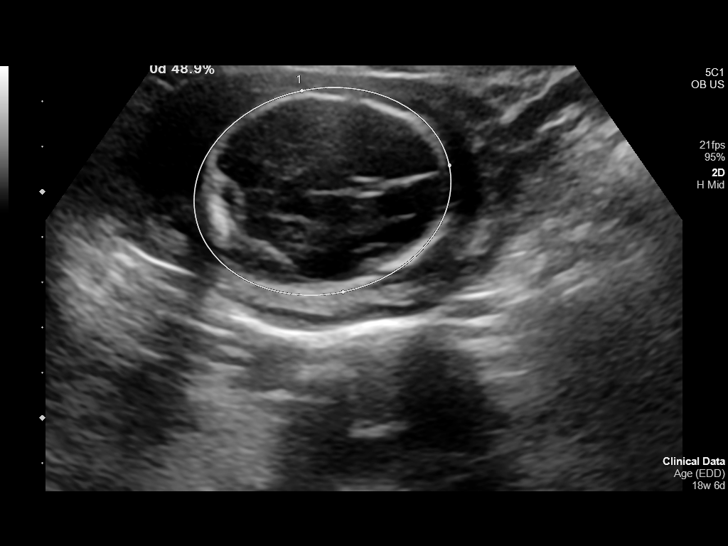
[im 25/73]
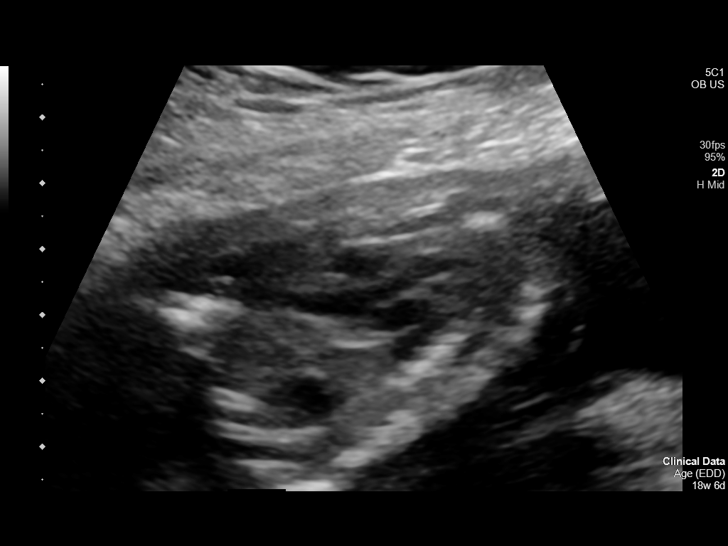
[im 30/73]
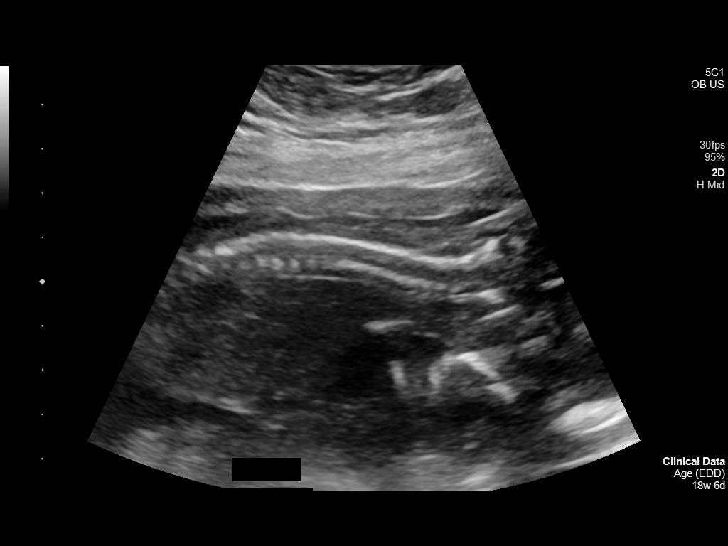
[im 38/73]
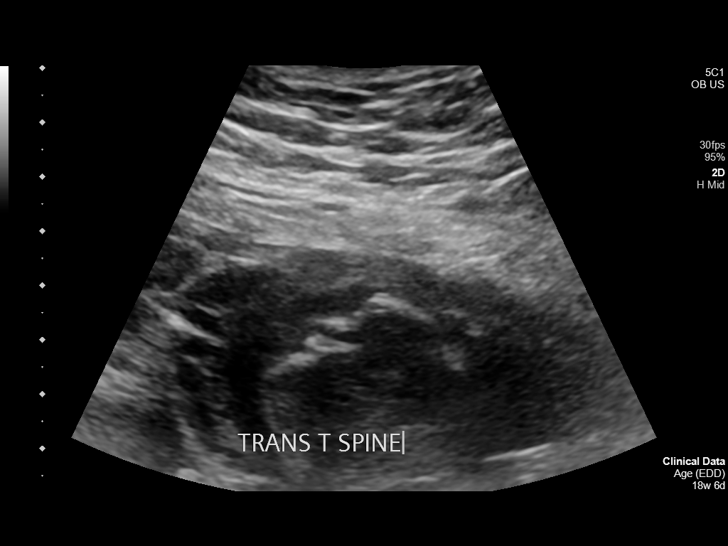
[im 43/73]
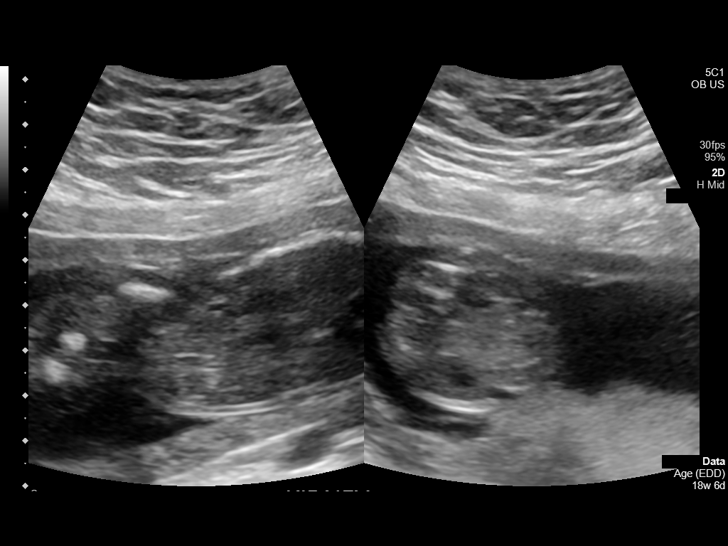
[im 49/73]
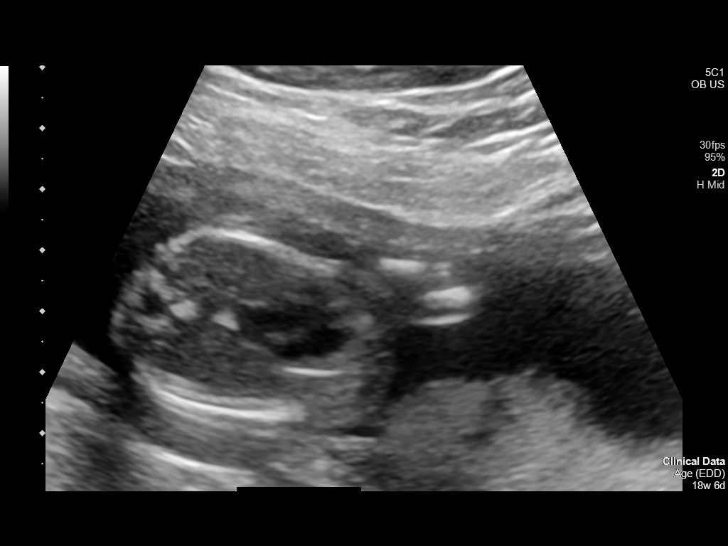
[im 54/73]
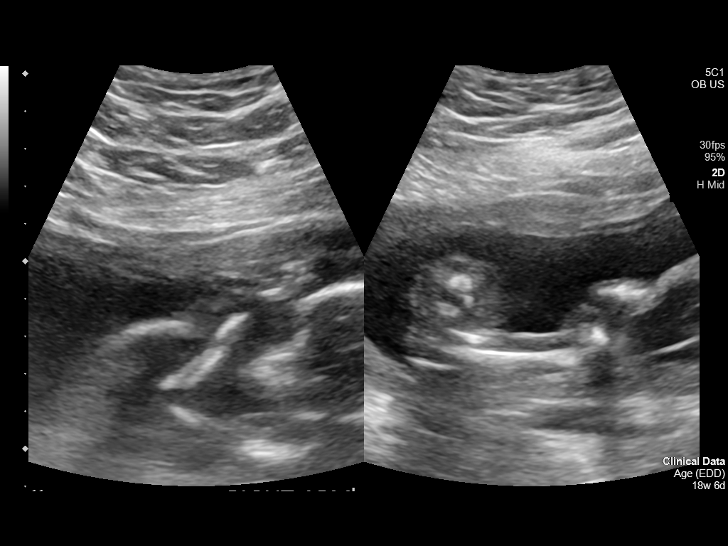
[im 59/73]
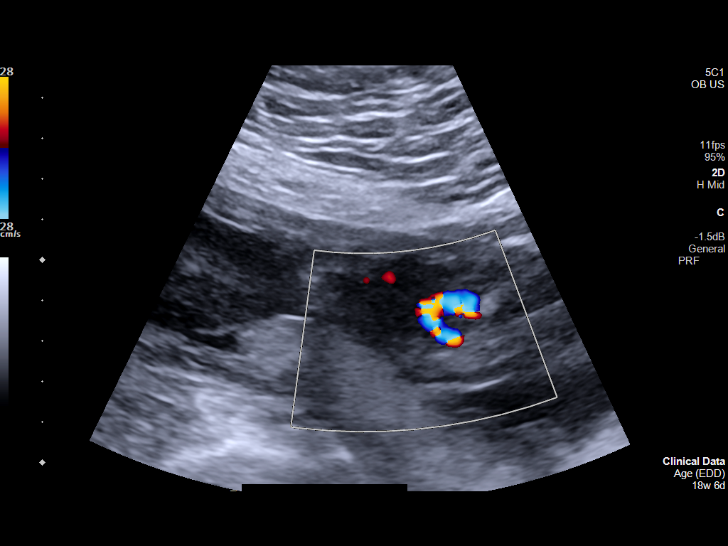
[im 65/73]
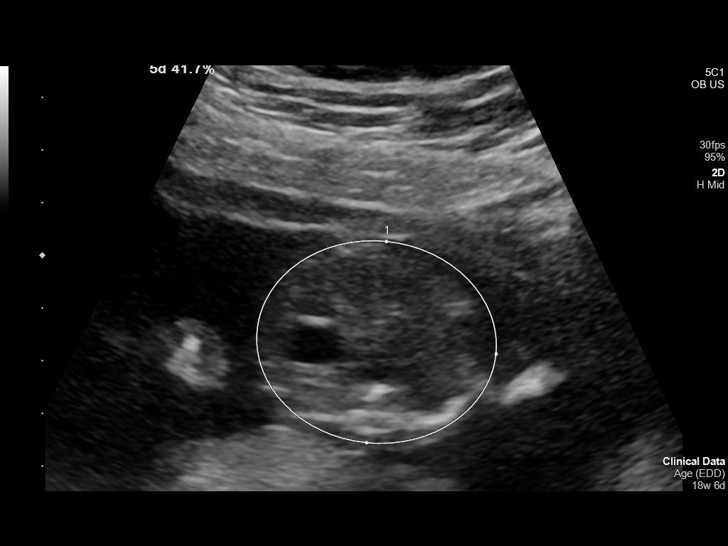
[im 70/73]
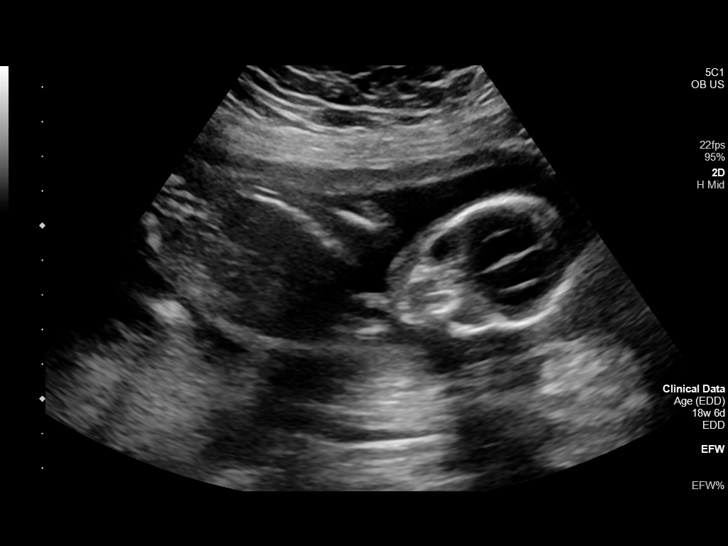

[13 of 28 positions shown; findings below may reference images not displayed]

FINDINGS: Number of Fetuses: 1

Heart Rate:  138 bpm

Movement: Yes

Presentation: Cephalic

Previa: No

Placental Location: Posterior

Amniotic Fluid (Subjective): Within normal limits

Amniotic Fluid (Objective):

Vertical pocket = 4.9cm

FETAL BIOMETRY

BPD: 4.3cm 19w 1d

HC:   16.2cm 19w 0d

AC:   13.4cm 18w 6d

FL:   3.4cm 20w 5d

Current Mean GA: 19w 6d US EDC: 12/21/2020

Assigned GA:  18w 6d Assigned EDC: 12/28/2020

FETAL ANATOMY

Lateral Ventricles: Appears normal

Thalami/CSP: Appears normal

Posterior Fossa:  Appears normal

Nuchal Region: Appears normal   NFT= 2.7 mm

Upper Lip: Appears normal

Spine: Limited views appear normal

4 Chamber Heart on Left: Not well visualized

LVOT: Not visualized

RVOT: Not visualized

Stomach on Left: Appears normal

3 Vessel Cord: Appears normal

Cord Insertion site: Appears normal

Kidneys: Appears normal

Bladder: Appears normal

Extremities: Appears normal

Technically difficult due to: Maternal habitus and fetal position

Maternal Findings:

Cervix:  4.4 cm TA
IMPRESSION: Assigned GA currently 18 weeks 6 days.  Appropriate fetal growth.

No fetal anomalies identified, however, four-chamber heart, cardiac
outflow tracts, and spine were not well visualized. Recommend
follow-up ultrasound in 3-4 weeks to complete fetal anatomic
evaluation.

## 2022-05-28 IMAGING — US US OB FOLLOW-UP
1 series · 13 of 28 positions shown · non-contrast
Comparison: none

CLINICAL DATA: Pregnancy.  Follow-up anatomy.

EXAM:
OBSTETRIC 14+ WK ULTRASOUND FOLLOW-UP

[Series 1: us ob follow up · 13 of 62 slices shown]
[im 3/62]
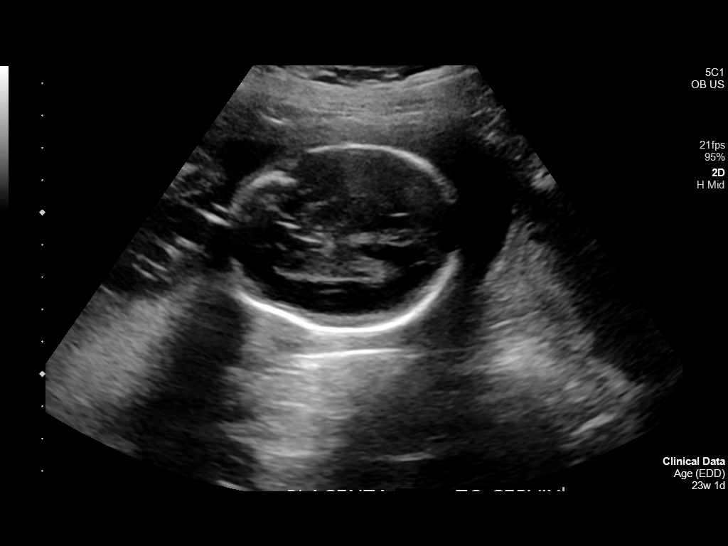
[im 7/62]
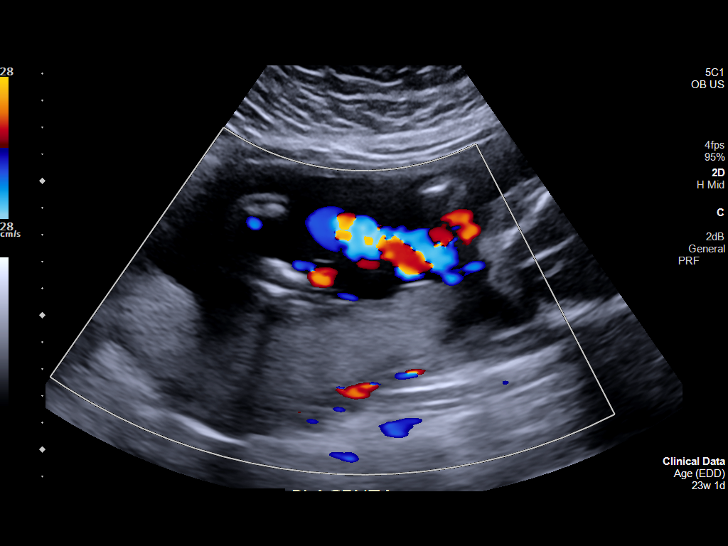
[im 12/62]
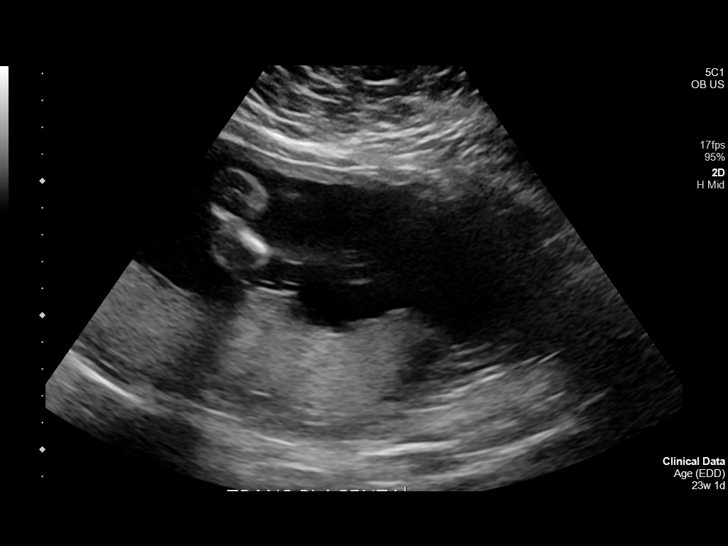
[im 16/62]
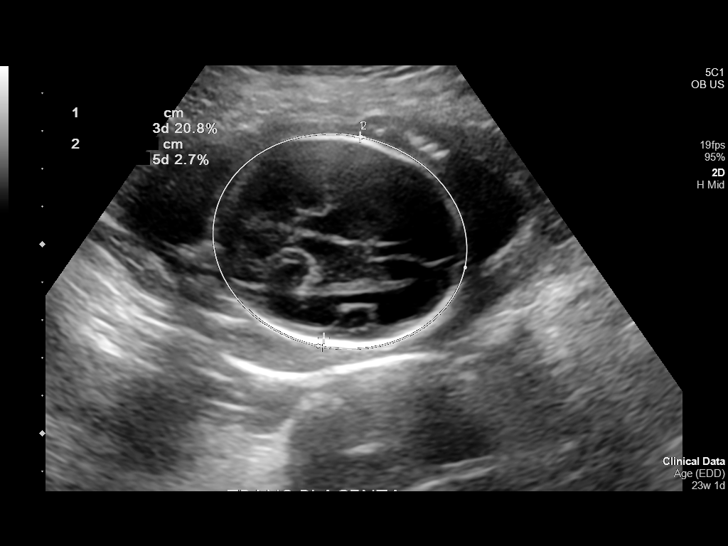
[im 21/62]
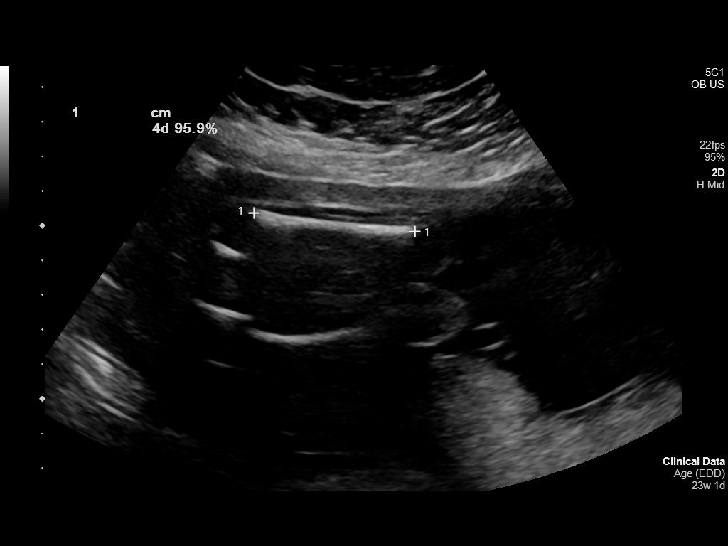
[im 25/62]
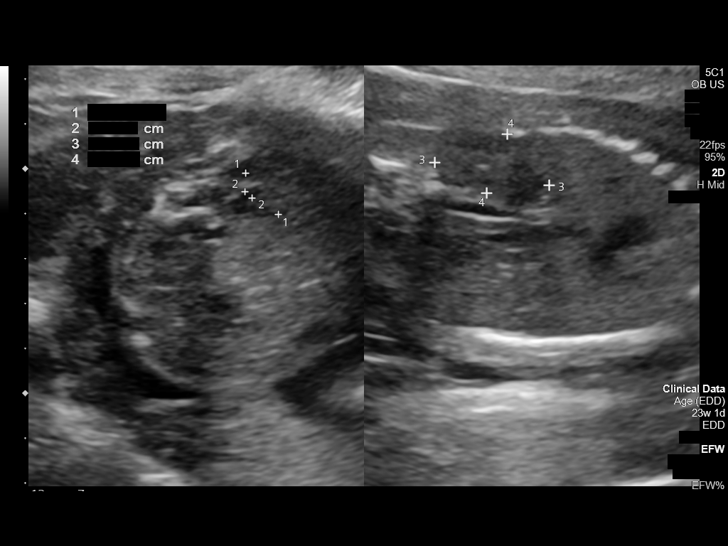
[im 32/62]
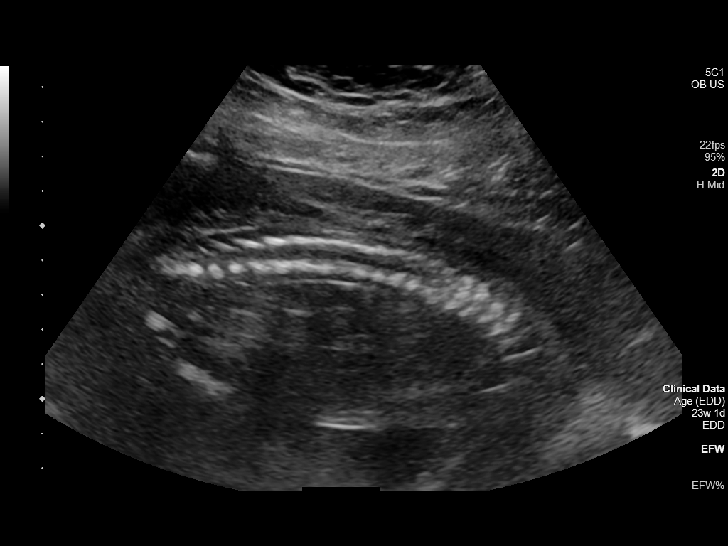
[im 37/62]
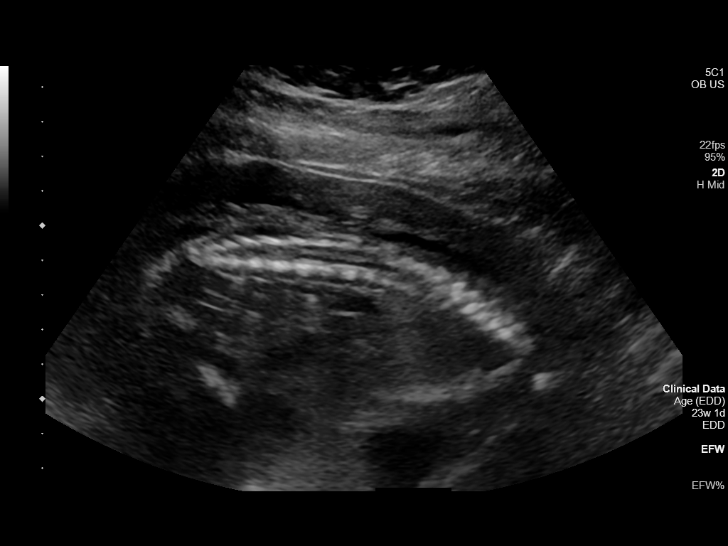
[im 41/62]
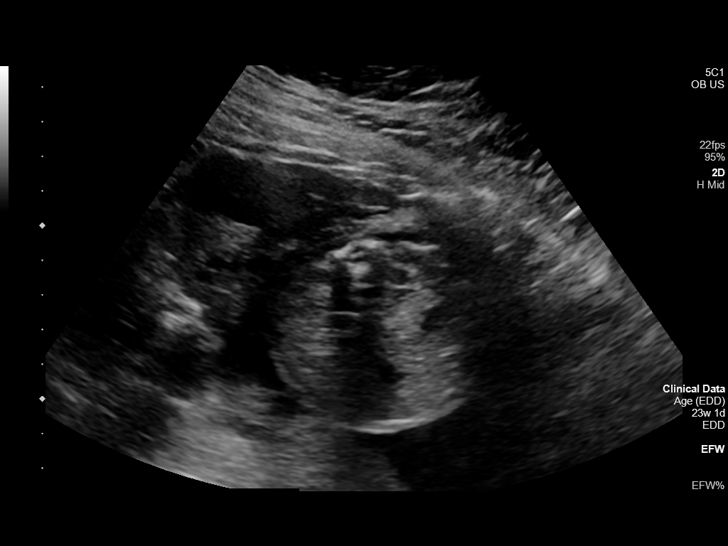
[im 46/62]
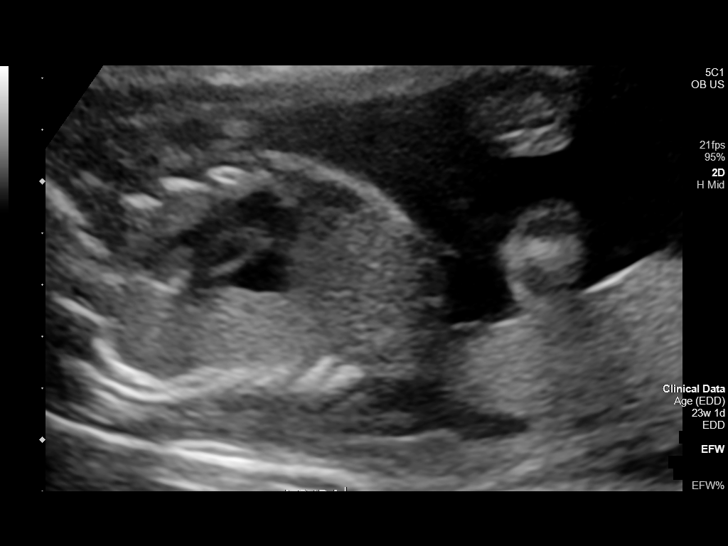
[im 50/62]
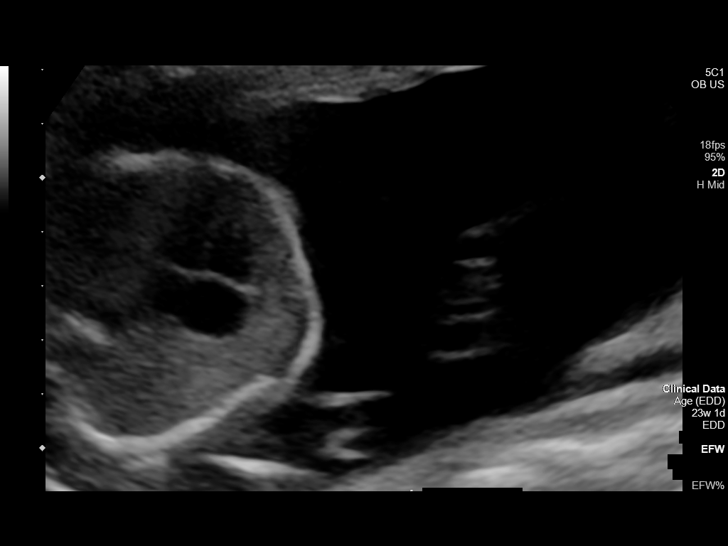
[im 55/62]
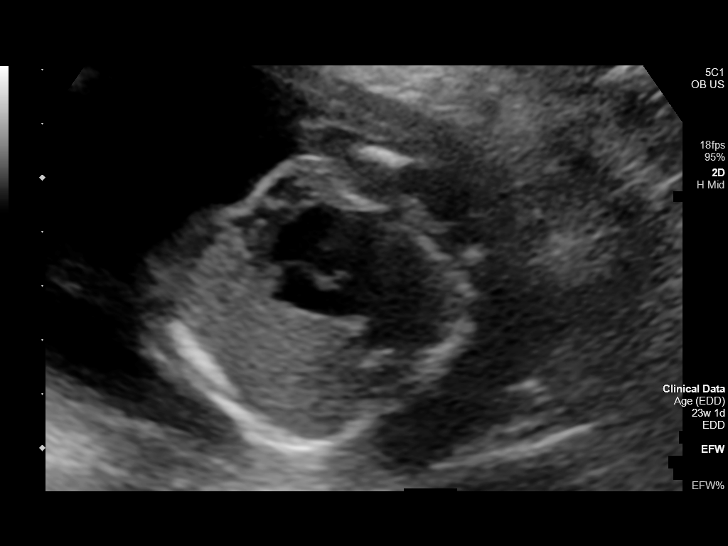
[im 59/62]
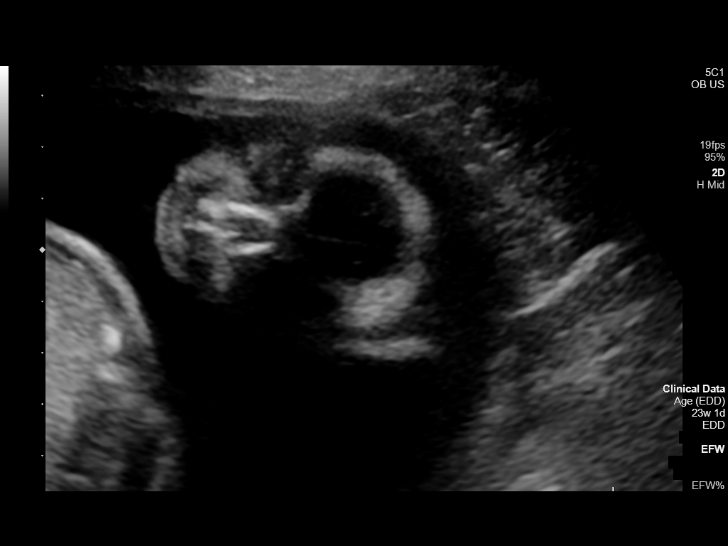

[13 of 28 positions shown; findings below may reference images not displayed]

FINDINGS: Number of Fetuses: 1

Heart Rate:  143 bpm

Movement: Yes

Presentation: Cephalic

Previa: No

Placental Location: Posterior

Amniotic Fluid (Subjective): Normal

Amniotic Fluid (Objective):

Vertical pocket 4.7cm

FETAL BIOMETRY

BPD:  5.4cm 22w 4d

HC:    19.8cm 22w 0d

AC:    18.2cm 23w 0d

FL:    4.6cm 25w 2d

Current Mean GA: 23w 2d US EDC: 12/27/2020

Assigned GA: 23w 1d Assigned EDC: 12/28/2020

Estimated Fetal Weight:  624g 71%ile

FETAL ANATOMY

Lateral Ventricles: Previously seen

Thalami/CSP: Previously seen

Posterior Fossa: Previously seen

Nuchal Region: Previously seen

Upper Lip: Previously seen

Spine: Appears normal

4 Chamber Heart on Left: Appears normal

LVOT: Appears normal

RVOT: Appears normal

Stomach on Left: Previously seen

3 Vessel Cord: Previously seen

Cord Insertion site: Previously seen

Kidneys: Previously seen

Bladder: Previously seen

Extremities: Previously seen

Sex: Previously Seen

Technical Limitations: None.

Maternal Findings:

Cervix:  Closed.  5.0 cm.
IMPRESSION: 1. Single live intrauterine gestation in cephalic presentation.
Assigned gestational age of 23 weeks 1 day. Adequate interval
growth.
2. Adequate visualization of the fetal spine, four-chamber heart,
and ventricular outflow tracts. Fetal anatomic survey is now
complete.

## 2022-11-26 ENCOUNTER — Ambulatory Visit: Payer: Medicaid Other
